# Patient Record
Sex: Female | Born: 1975 | Race: White | Hispanic: No | Marital: Married | Smoking: Never smoker
Health system: Southern US, Community
[De-identification: ages and names within clinical notes are randomized; demographics above are authoritative.]

## PROBLEM LIST (undated history)

## (undated) DIAGNOSIS — F419 Anxiety disorder, unspecified: Secondary | ICD-10-CM

## (undated) DIAGNOSIS — F329 Major depressive disorder, single episode, unspecified: Secondary | ICD-10-CM

## (undated) DIAGNOSIS — B009 Herpesviral infection, unspecified: Secondary | ICD-10-CM

## (undated) DIAGNOSIS — O480 Post-term pregnancy: Secondary | ICD-10-CM

## (undated) DIAGNOSIS — F32A Depression, unspecified: Secondary | ICD-10-CM

## (undated) DIAGNOSIS — O26849 Uterine size-date discrepancy, unspecified trimester: Secondary | ICD-10-CM

## (undated) HISTORY — PX: WISDOM TOOTH EXTRACTION: SHX21

## (undated) HISTORY — DX: Herpesviral infection, unspecified: B00.9

## (undated) HISTORY — PX: FRACTURE SURGERY: SHX138

## (undated) HISTORY — PX: ANKLE SURGERY: SHX546

## (undated) HISTORY — PX: WRIST SURGERY: SHX841

## (undated) HISTORY — DX: Depression, unspecified: F32.A

## (undated) HISTORY — DX: Major depressive disorder, single episode, unspecified: F32.9

---

## 2010-12-20 ENCOUNTER — Ambulatory Visit
Admission: RE | Admit: 2010-12-20 | Discharge: 2010-12-20 | Disposition: A | Discharge status: Home / Self Care | Payer: 59 | Source: Ambulatory Visit | Attending: Family Medicine | Admitting: Family Medicine

## 2010-12-20 ENCOUNTER — Other Ambulatory Visit: Payer: Self-pay | Admitting: Family Medicine

## 2010-12-20 ENCOUNTER — Ambulatory Visit (INDEPENDENT_AMBULATORY_CARE_PROVIDER_SITE_OTHER): Payer: 59 | Admitting: Family Medicine

## 2010-12-20 VITALS — BP 100/60 | Ht 67.0 in | Wt 135.0 lb

## 2010-12-20 DIAGNOSIS — M25572 Pain in left ankle and joints of left foot: Secondary | ICD-10-CM | POA: Insufficient documentation

## 2010-12-20 DIAGNOSIS — N912 Amenorrhea, unspecified: Secondary | ICD-10-CM

## 2010-12-20 DIAGNOSIS — M25579 Pain in unspecified ankle and joints of unspecified foot: Secondary | ICD-10-CM

## 2010-12-20 DIAGNOSIS — N926 Irregular menstruation, unspecified: Secondary | ICD-10-CM

## 2010-12-20 MED ORDER — HYDROCODONE-ACETAMINOPHEN 5-325 MG PO TABS: 2.0000 | ORAL_TABLET | Freq: Three times a day (TID) | ORAL | Status: AC | PRN

## 2010-12-20 NOTE — Progress Notes (Signed)
  Subjective:    Patient ID: Latoya Weiss, female    DOB: 01/23/1976, 35 y.o.   MRN: [id number]  HPI  Date of injury December 18, 2010 She was jumping from rock to rock while out hiking. Came down really hard on her left ankle. Immediately had pain. Was able to hobble back to car  using assistance. She was wearing hiking boots. Pain has continued. She's got a lot of swelling g and stiffness. She is not really able to bear weight, except with crutches which she has been using.   PERTINENT  PMH / PSH: History of left wrist fracture requiring 3 separate surgeries in 2011. No history of any other fractures. Nonsmoker. Review of Systems Denies unusual weight change recently. She is currently trying to get pregnant. Denies fever. Denies numbness in her left foot.    Objective:   Physical Exam  Vital signs reviewed. GENERAL: Well-developed female no acute distress. She is using crutches to walk. ANKLE:  left.  . She is tender over the medial malleolus. The lateral malleolus is nontender to palpation The heel is somewhat tender to squeeze. Distally all of her toes have full range of motion in flexion and extension.  Longitudinal and transverse arches are maintained. Nontender over 5th MT and 1st MT SKIN: Significant ecchymoses on the area behind the medial malleolus extending up into the calf.:. There is no bruising or ecchymoses on the lateral portion of her foot.  NEURO: intact sensation to soft touch throughout the foot and ankle VASCULAR: normal capillary refill left foot and ankle ULTRASOUND: Repair fracture of the medial malleoli or area.      Assessment & Plan:  Concerned for ankle fracture. I have placed her in a Cam Walker boot and will have her do touchdown weightbearing only with her crutches. We will get x-rays and I will call her. [phone]

## 2010-12-20 NOTE — Progress Notes (Signed)
Medial tibia fracture--transverse--appears displaced. Appt made at [facility] today

## 2011-03-10 ENCOUNTER — Ambulatory Visit (INDEPENDENT_AMBULATORY_CARE_PROVIDER_SITE_OTHER): Payer: Self-pay | Admitting: Sports Medicine

## 2011-03-10 VITALS — BP 110/60

## 2011-03-10 DIAGNOSIS — M79672 Pain in left foot: Secondary | ICD-10-CM

## 2011-03-10 DIAGNOSIS — M79609 Pain in unspecified limb: Secondary | ICD-10-CM

## 2011-03-10 NOTE — Progress Notes (Signed)
  Subjective:    Patient ID: Latoya Weiss, female    DOB: [birth year], 36 y.o.   MRN: [id number]  HPI In 2010 she fell on outstretched left wrist and distal radial fx that required 3 surgeries  Oct 2012 jumped down and fractured left medial malleolus and had this pinned Seen first by Dr Wadie Lessen to Dr Dion Saucier who did the surgery  Dec 26 able to get out of [place]oot and walking since then and feeling better  Left forefoot started hurting 4 days ago after walking about 1 mile   Review of Systems     Objective:   Physical Exam  Patient is not in acute distress but is walking with a limp favoring the left foot On examination she has swelling in the left lateral forefoot on the dorsum The third metatarsal is tender to pressure The first second fourth and fifth are nontender The MTP joints are nonpainful She has a neutral arch and does not handout any real forefoot break down  Scar over the medial malleolus is nontender There is some visual change and still some slight swelling in the ankle area There is no real tenderness over the area of prior surgery  MSK ultrasound Left metatarsals are all scanned and appear normal with the exception of Fourth metatarsal shows a hypoechoic lying just above the distal shaft The first MTP joint is unremarkable Doppler activity is only minimally increased No cortical disruption is seen  Scan of the medial malleolus and this looks to be healing well with intact bone Note there is not any joint effusion of the ankle on scanning      Assessment & Plan:

## 2011-03-10 NOTE — Assessment & Plan Note (Signed)
I think she simply walked too much since she had been out of the boot for a relatively short period of time I am concerned because she's had a couple of fractures in the last 2 years but these do not sound like they come from low bone density but rather from accidents She has normal menses and no major risk factors  This appears more like a bone stress reaction and not a true stress fracture so we will put her in a postop shoe for one week These arch straps Avoid walking Ice at the end of the day Okay to cross train on the bike  Will check the degree of swelling in a week to 10 days and to transition her back to a shoe

## 2011-05-05 ENCOUNTER — Other Ambulatory Visit (HOSPITAL_COMMUNITY)
Admission: RE | Admit: 2011-05-05 | Discharge: 2011-05-05 | Disposition: A | Discharge status: Home / Self Care | Payer: 59 | Source: Ambulatory Visit | Attending: Obstetrics and Gynecology | Admitting: Obstetrics and Gynecology

## 2011-05-05 DIAGNOSIS — Z01419 Encounter for gynecological examination (general) (routine) without abnormal findings: Secondary | ICD-10-CM | POA: Insufficient documentation

## 2011-05-05 DIAGNOSIS — N760 Acute vaginitis: Secondary | ICD-10-CM | POA: Insufficient documentation

## 2011-05-05 DIAGNOSIS — Z1159 Encounter for screening for other viral diseases: Secondary | ICD-10-CM | POA: Insufficient documentation

## 2011-06-01 ENCOUNTER — Ambulatory Visit (INDEPENDENT_AMBULATORY_CARE_PROVIDER_SITE_OTHER): Payer: 59 | Admitting: Family Medicine

## 2011-06-01 VITALS — BP 100/70

## 2011-06-01 DIAGNOSIS — M25579 Pain in unspecified ankle and joints of unspecified foot: Secondary | ICD-10-CM

## 2011-06-01 DIAGNOSIS — M25572 Pain in left ankle and joints of left foot: Secondary | ICD-10-CM

## 2011-06-02 NOTE — Progress Notes (Signed)
  Subjective:    Patient ID: Latoya Weiss, female    DOB: May 12, 1975, 36 y.o.   MRN: [id number]  HPI 36 y/o female s/p 3 months s/p ORIF of the left ankle is here c/o continued pain in the area of the incision as well as tightness and pain in the area of the achille's tendon.  She is recently recovered from a metatarsal stress fracture of the same foot but has no recent trauma.    Review of Systems     Objective:   Physical Exam  Surgical incision is clean, dry, and intact with no sign of infection There is mild swelling in the area  Tenderness to palpation about the area of the incision Tenderness to palpation about the medial aspect of the achille's tendon No swelling about the achille's tendon Decreased ROM in plantar and dorsiflexion at the ankle Gait is normal Mild tenderness to palpation along the posterior medial malleolus   Ultrasound; There is some edema surrounding the posterior tib tendon just inferior to the medial malleolus but the tendon appears intact. The achille's tendon is intact with no fluid collection.  There is no fluid collection in the posterior calcaneal bursa.  There is no increased doppler activity at the posterior tib tendon or at the achille's tendon.       Assessment & Plan:

## 2011-06-02 NOTE — Assessment & Plan Note (Addendum)
Heel wedge, topical anti-inflammatory #5 from [facility] pharmacy, and eccentric training for the Achille's tendon stiffness.  We will do formal physical therapy for ankle ROM.  Physical therapy will also do modalities for the achille's.  We will see her back in one month.  Regarding the incisional pain, if this continues she should follow up with ortho for evaluation.  It is unclear if this is totally incisional or if it is due to a hardware issue.

## 2011-06-14 ENCOUNTER — Ambulatory Visit: Payer: 59 | Attending: Family Medicine

## 2011-06-14 DIAGNOSIS — IMO0001 Reserved for inherently not codable concepts without codable children: Secondary | ICD-10-CM | POA: Insufficient documentation

## 2011-06-14 DIAGNOSIS — M25579 Pain in unspecified ankle and joints of unspecified foot: Secondary | ICD-10-CM | POA: Insufficient documentation

## 2011-06-14 DIAGNOSIS — M25673 Stiffness of unspecified ankle, not elsewhere classified: Secondary | ICD-10-CM | POA: Insufficient documentation

## 2011-06-14 DIAGNOSIS — M25676 Stiffness of unspecified foot, not elsewhere classified: Secondary | ICD-10-CM | POA: Insufficient documentation

## 2011-06-14 DIAGNOSIS — R262 Difficulty in walking, not elsewhere classified: Secondary | ICD-10-CM | POA: Insufficient documentation

## 2011-06-20 ENCOUNTER — Ambulatory Visit: Payer: 59

## 2011-06-22 ENCOUNTER — Ambulatory Visit: Payer: 59

## 2011-06-27 ENCOUNTER — Ambulatory Visit: Payer: 59

## 2011-06-29 ENCOUNTER — Ambulatory Visit: Payer: 59 | Attending: Family Medicine

## 2011-06-29 ENCOUNTER — Ambulatory Visit (INDEPENDENT_AMBULATORY_CARE_PROVIDER_SITE_OTHER): Payer: 59 | Admitting: Family Medicine

## 2011-06-29 ENCOUNTER — Encounter: Payer: Self-pay | Admitting: Family Medicine

## 2011-06-29 VITALS — BP 123/78 | HR 103

## 2011-06-29 DIAGNOSIS — R262 Difficulty in walking, not elsewhere classified: Secondary | ICD-10-CM | POA: Insufficient documentation

## 2011-06-29 DIAGNOSIS — M25572 Pain in left ankle and joints of left foot: Secondary | ICD-10-CM

## 2011-06-29 DIAGNOSIS — IMO0001 Reserved for inherently not codable concepts without codable children: Secondary | ICD-10-CM | POA: Insufficient documentation

## 2011-06-29 DIAGNOSIS — M25579 Pain in unspecified ankle and joints of unspecified foot: Secondary | ICD-10-CM

## 2011-06-29 DIAGNOSIS — M25676 Stiffness of unspecified foot, not elsewhere classified: Secondary | ICD-10-CM | POA: Insufficient documentation

## 2011-06-29 DIAGNOSIS — M25673 Stiffness of unspecified ankle, not elsewhere classified: Secondary | ICD-10-CM | POA: Insufficient documentation

## 2011-06-29 NOTE — Progress Notes (Signed)
  Subjective:    Patient ID: Latoya Weiss, female    DOB: 10-13-1975, 36 y.o.   MRN: [id number]  HPI 36 y/o female is for follow up for continue left ankle pain and tightness after ORIF of a medial malleolus fracture in October 2012.  She has been to physical therapy and feels that the tightness is improved.  She still cannot jog without a limp and pain.  She uses dermatran topical which helps the pain when she gets it.   Review of Systems     Objective:   Physical Exam  ROM from PT: DF102; PF 54; IN 40; EV 22 No tenderness to palpation at the achilles Mild tenderness to palpation near the incision, the swelling in this area is much improved from the previous exam The skin is intact.  The screws are not palpable.  Gait: Walk is neutral but she has a noticeable limp with any attempt at jogging.  She is not able to move into a full jog, more like a fast walk.      Assessment & Plan:

## 2011-06-29 NOTE — Patient Instructions (Signed)
Follow up appointment is made for 17 June.  There are no changes in the treatment plan for now.

## 2011-06-29 NOTE — Assessment & Plan Note (Signed)
She is improved from her previous visit but still has a significant amount of pain for being over 6 months post surgery.  I have discussed her case with Dr. Dion Saucier, the surgeon.  He says that if she has continued pain that she should follow up with him for evaluation to discuss whether removal of the hardware would be beneficial.  The patient would not rather follow up with the surgeon at this point.  She will continue with physical therapy and dermatran topical #5 for pain.

## 2011-07-04 ENCOUNTER — Ambulatory Visit: Payer: 59

## 2011-07-07 ENCOUNTER — Ambulatory Visit: Payer: 59

## 2011-07-11 ENCOUNTER — Ambulatory Visit: Payer: 59

## 2011-07-14 ENCOUNTER — Ambulatory Visit: Payer: 59

## 2011-08-04 ENCOUNTER — Ambulatory Visit: Payer: 59 | Attending: Family Medicine

## 2011-08-04 DIAGNOSIS — M25579 Pain in unspecified ankle and joints of unspecified foot: Secondary | ICD-10-CM | POA: Insufficient documentation

## 2011-08-04 DIAGNOSIS — M25676 Stiffness of unspecified foot, not elsewhere classified: Secondary | ICD-10-CM | POA: Insufficient documentation

## 2011-08-04 DIAGNOSIS — R262 Difficulty in walking, not elsewhere classified: Secondary | ICD-10-CM | POA: Insufficient documentation

## 2011-08-04 DIAGNOSIS — IMO0001 Reserved for inherently not codable concepts without codable children: Secondary | ICD-10-CM | POA: Insufficient documentation

## 2011-08-04 DIAGNOSIS — M25673 Stiffness of unspecified ankle, not elsewhere classified: Secondary | ICD-10-CM | POA: Insufficient documentation

## 2011-08-15 ENCOUNTER — Ambulatory Visit (INDEPENDENT_AMBULATORY_CARE_PROVIDER_SITE_OTHER): Payer: 59 | Admitting: Sports Medicine

## 2011-08-15 VITALS — BP 100/68

## 2011-08-15 DIAGNOSIS — M79672 Pain in left foot: Secondary | ICD-10-CM

## 2011-08-15 DIAGNOSIS — M79609 Pain in unspecified limb: Secondary | ICD-10-CM

## 2011-08-15 DIAGNOSIS — M25572 Pain in left ankle and joints of left foot: Secondary | ICD-10-CM

## 2011-08-15 DIAGNOSIS — M25579 Pain in unspecified ankle and joints of unspecified foot: Secondary | ICD-10-CM

## 2011-08-15 NOTE — Patient Instructions (Addendum)
We will call you when we get the medium bodyhelix x ankle sleeves in the next few days  Try alternating 1 min jog and 1 min walk on treadmill start with 10 minutes-  increase by 5 minutes each week  When you get comfortable running on treadmill for 30 minutes - It is ok to try running outside  Please follow up in 6 weeks  Thank you for seeing [place] today!

## 2011-08-15 NOTE — Progress Notes (Signed)
  Subjective:    Patient ID: Latoya Weiss, female    DOB: October 18, 1975, 36 y.o.   MRN: [id number]  HPI  Latoya Weiss returns from a period of time doing PT at [facility] Left ankle feels stronger No longer any foot pain over the forefoot (that took 6 weeks) Able to walk now with no limp Running causes  Pain and limp At PT able to run on trampoline for 10 minutes with no pain  Still hypersensitive below scar on left medial malleolus No real swelling in this area now Not as painful as before  By end of day does have pain up to 3/10  Review of Systems     Objective:   Physical Exam  NAD  Left Ankle: No visible erythema or swelling. Scar just below medial malleolus Range of motion is full in all directions. Strength is 5/5 in all directions.  Stable lateral and medial ligaments; squeeze test and kleiger test unremarkable; Talar dome nontender; No pain at base of 5th MT; No tenderness over cuboid; No tenderness over N spot or navicular prominence  tenderness on inferior aspects of medial malleolus and sensitive to touch No sign of peroneal tendon subluxations;  Walks even quickly with no limp  Runs with slight limp on left foot       Assessment & Plan:

## 2011-08-15 NOTE — Assessment & Plan Note (Addendum)
She has gained strength from PT  Can now start HEP at [facility]  Try compression X brace - no longer needs lace-up ASO  Begin progressive walk/jog program on treadmill  No running on hard surfaces prior to reaching 30 mins on Tm or else prior to reck in 6 weeks

## 2011-08-15 NOTE — Assessment & Plan Note (Signed)
This has resolved  Suspect that as her gait became more normal the bone stress resolved  Follow if any return of sxs

## 2012-06-01 LAB — OB RESULTS CONSOLE RPR: RPR: NONREACTIVE

## 2012-06-01 LAB — OB RESULTS CONSOLE ANTIBODY SCREEN: ANTIBODY SCREEN: NEGATIVE

## 2012-06-01 LAB — OB RESULTS CONSOLE GC/CHLAMYDIA
Chlamydia: NEGATIVE
GC PROBE AMP, GENITAL: NEGATIVE

## 2012-06-01 LAB — OB RESULTS CONSOLE HIV ANTIBODY (ROUTINE TESTING): HIV: NONREACTIVE

## 2012-06-01 LAB — OB RESULTS CONSOLE ABO/RH: RH Type: POSITIVE

## 2012-06-01 LAB — OB RESULTS CONSOLE HEPATITIS B SURFACE ANTIGEN: Hepatitis B Surface Ag: NEGATIVE

## 2012-06-01 LAB — OB RESULTS CONSOLE RUBELLA ANTIBODY, IGM: RUBELLA: IMMUNE

## 2012-07-03 ENCOUNTER — Emergency Department (HOSPITAL_BASED_OUTPATIENT_CLINIC_OR_DEPARTMENT_OTHER)
Admission: EM | Admit: 2012-07-03 | Discharge: 2012-07-03 | Disposition: A | Discharge status: Home / Self Care | Payer: 59 | Attending: Emergency Medicine | Admitting: Emergency Medicine

## 2012-07-03 ENCOUNTER — Other Ambulatory Visit: Payer: Self-pay

## 2012-07-03 ENCOUNTER — Encounter (HOSPITAL_BASED_OUTPATIENT_CLINIC_OR_DEPARTMENT_OTHER): Payer: Self-pay | Admitting: *Deleted

## 2012-07-03 ENCOUNTER — Emergency Department (HOSPITAL_BASED_OUTPATIENT_CLINIC_OR_DEPARTMENT_OTHER): Payer: 59

## 2012-07-03 DIAGNOSIS — G8929 Other chronic pain: Secondary | ICD-10-CM | POA: Insufficient documentation

## 2012-07-03 DIAGNOSIS — Z79899 Other long term (current) drug therapy: Secondary | ICD-10-CM | POA: Insufficient documentation

## 2012-07-03 DIAGNOSIS — R5381 Other malaise: Secondary | ICD-10-CM | POA: Insufficient documentation

## 2012-07-03 DIAGNOSIS — F411 Generalized anxiety disorder: Secondary | ICD-10-CM | POA: Insufficient documentation

## 2012-07-03 DIAGNOSIS — R079 Chest pain, unspecified: Secondary | ICD-10-CM | POA: Insufficient documentation

## 2012-07-03 DIAGNOSIS — Z3202 Encounter for pregnancy test, result negative: Secondary | ICD-10-CM | POA: Insufficient documentation

## 2012-07-03 DIAGNOSIS — R209 Unspecified disturbances of skin sensation: Secondary | ICD-10-CM | POA: Insufficient documentation

## 2012-07-03 HISTORY — DX: Anxiety disorder, unspecified: F41.9

## 2012-07-03 LAB — URINALYSIS, ROUTINE W REFLEX MICROSCOPIC
Glucose, UA: NEGATIVE mg/dL
Leukocytes, UA: NEGATIVE
Specific Gravity, Urine: 1.006 (ref 1.005–1.030)
pH: 6.5 (ref 5.0–8.0)

## 2012-07-03 LAB — TROPONIN I: Troponin I: <0.3 ng/mL (ref <0.30)

## 2012-07-03 LAB — D-DIMER, QUANTITATIVE: D-Dimer, Quant: <0.27 ug/mL-FEU (ref 0.00–0.48)

## 2012-07-03 LAB — URINE MICROSCOPIC-ADD ON

## 2012-07-03 LAB — PREGNANCY, URINE: Preg Test, Ur: NEGATIVE

## 2012-07-03 NOTE — ED Provider Notes (Signed)
History     CSN: [id number]  Arrival date & time 07/03/12  1922   First MD Initiated Contact with Patient 07/03/12 2125      Chief Complaint  Patient presents with  . Abdominal Pain   chest pain  (Consider location/radiation/quality/duration/timing/severity/associated sxs/prior treatment) HPI This 37 year old female has to 3 weeks of constant left-sided anterior well localized nonradiating chest pain is not abdominal pain contrary to the original chief complaint, she is no nausea vomiting diarrhea no vaginal bleeding or discharge no dysuria no cough no fever no shortness breath no back pain no change in her chronic left arm pain weakness and numbness which she has had stable for years, she does not have any travel or immobilization recent surgery but she does have estrogen hormone therapy, her pain is sharp stabbing mild to moderately severe worse with palpation and torso position changes. Past Medical History  Diagnosis Date  . Anxiety     Past Surgical History  Procedure Laterality Date  . Wrist surgery    . Ankle surgery    . Wisdom tooth extraction      No family history on file.  History  Substance Use Topics  . Smoking status: Never Smoker   . Smokeless tobacco: Never Used  . Alcohol Use: Yes    OB History   Grav Para Term Preterm Abortions TAB SAB Ect Mult Living                  Review of Systems 10 Systems reviewed and are negative for acute change except as noted in the HPI. Allergies  Review of patient's allergies indicates no known allergies.  Home Medications   Current Outpatient Rx  Name  Route  Sig  Dispense  Refill  . BuPROPion HCl (WELLBUTRIN PO)   Oral   Take by mouth daily.           . Norethindrone Acetate-Ethinyl Estradiol (MICROGESTIN) 1.5-30 MG-MCG tablet   Oral   Take 1 tablet by mouth daily.             BP 135/67  Pulse 77  Temp(Src) 98.3 F (36.8 C) (Oral)  Resp 20  Wt 125 lb (56.7 kg)  BMI 19.57 kg/m2  SpO2 100%  LMP  06/04/2012  Physical Exam  Nursing note and vitals reviewed. Constitutional:  Awake, alert, nontoxic appearance.  HENT:  Head: Atraumatic.  Eyes: Right eye exhibits no discharge. Left eye exhibits no discharge.  Neck: Neck supple.  Cardiovascular: Normal rate and regular rhythm.   No murmur heard. Pulmonary/Chest: Effort normal and breath sounds normal. No respiratory distress. She has no wheezes. She has no rales. She exhibits tenderness.  Reproducible left anterior chest wall tenderness without rash  Abdominal: Soft. There is no tenderness. There is no rebound.  Musculoskeletal: She exhibits no edema and no tenderness.  Baseline ROM, no obvious new focal weakness.  Neurological:  Mental status and motor strength appears baseline for patient and situation.  Skin: No rash noted.  Psychiatric: She has a normal mood and affect.    ED Course  Procedures (including critical care time) ECG: Normal sinus rhythm, ventricular rate 82, normal axis, RSR time in lead V1, no acute ischemic changes noted, no comparison ECG available Labs Reviewed  URINALYSIS, ROUTINE W REFLEX MICROSCOPIC - Abnormal; Notable for the following:    Hgb urine dipstick MODERATE (*)    All other components within normal limits  PREGNANCY, URINE  URINE MICROSCOPIC-ADD ON  TROPONIN I  D-DIMER,  QUANTITATIVE   Dg Chest 2 View  07/03/2012  *RADIOLOGY REPORT*  Clinical Data: 37 year old female chest pain on the left, under the left rib cage.  Shortness of breath.  CHEST - 2 VIEW  Comparison: None.  Findings: Lung volumes are within normal limits.  Cardiac size and mediastinal contours are within normal limits.  Visualized tracheal air column is within normal limits.  No pneumothorax.  No pleural effusion, pulmonary edema or confluent pulmonary opacity.  Minimal to mild scoliosis. No acute osseous abnormality identified.  IMPRESSION: No acute cardiopulmonary abnormality.   Original Report Authenticated By: Erskine Speed, M.D.       1. Chest pain       MDM  Patient / Family / Caregiver informed of clinical course, understand medical decision-making process, and agree with plan.  I doubt any other EMC precluding discharge at this time including, but not necessarily limited to the following:ACS, PE.        Hurman Horn, MD 07/04/12 2233

## 2012-07-03 NOTE — ED Notes (Signed)
Pain in her left upper quad for 3 weeks.

## 2012-07-12 ENCOUNTER — Ambulatory Visit (INDEPENDENT_AMBULATORY_CARE_PROVIDER_SITE_OTHER): Payer: 59 | Admitting: Internal Medicine

## 2012-07-12 VITALS — BP 130/70 | Ht 66.0 in | Wt 128.0 lb

## 2012-07-12 DIAGNOSIS — R071 Chest pain on breathing: Secondary | ICD-10-CM

## 2012-07-12 DIAGNOSIS — S2232XA Fracture of one rib, left side, initial encounter for closed fracture: Secondary | ICD-10-CM

## 2012-07-12 DIAGNOSIS — Z3183 Encounter for assisted reproductive fertility procedure cycle: Secondary | ICD-10-CM | POA: Insufficient documentation

## 2012-07-12 DIAGNOSIS — S2239XA Fracture of one rib, unspecified side, initial encounter for closed fracture: Secondary | ICD-10-CM

## 2012-07-12 DIAGNOSIS — R0789 Other chest pain: Secondary | ICD-10-CM

## 2012-07-12 MED ORDER — MELOXICAM 15 MG PO TABS: 15.0000 mg | ORAL_TABLET | Freq: Every day | ORAL | Status: DC

## 2012-07-12 MED ORDER — HYDROCODONE-ACETAMINOPHEN 5-325 MG PO TABS: ORAL_TABLET | ORAL | Status: DC

## 2012-07-12 NOTE — Progress Notes (Signed)
  Subjective:    Patient ID: Latoya Weiss, female    DOB: September 25, 1975, 37 y.o.   MRN: [id number]  HPI37yo with 5 weeks chest pain Emergency room evaluation several days ago had normal EKG chest x-ray and d-dimer. She continues to experience mechanical-type pain in the left anterior chest wall without shortness of breath She continues to try to exercise although several things or painful There is concern that she has had several fractures in recent years   Husband out of work for the last year Review of Systems Currently undergoing in vitro fertilization    Objective:   Physical Exam Vital signs stable HEENT clear Heart regular without murmur Lungs clear Deep inspiration creates pain left anterior chest wall There is tenderness over the fourth and fifth ribs in the anterior axillary line just below the left breast  Ultrasound evaluation per Dr. Darrick Penna suggest nondisplaced fractures in this area Review of the emergency room x-ray has question of nondisplaced fracture line in the PA chest left 5th rib       Assessment & Plan:  Problem #1 chest wall pain secondary to nondisplaced rib fracture of uncertain etiology  Plan meloxicam

## 2012-09-11 ENCOUNTER — Encounter: Payer: Self-pay | Admitting: Internal Medicine

## 2012-09-11 ENCOUNTER — Encounter: Payer: Self-pay | Admitting: Radiology

## 2012-09-11 ENCOUNTER — Telehealth: Payer: Self-pay | Admitting: Radiology

## 2012-09-11 DIAGNOSIS — Z3400 Encounter for supervision of normal first pregnancy, unspecified trimester: Secondary | ICD-10-CM | POA: Insufficient documentation

## 2012-09-11 DIAGNOSIS — O0001 Abdominal pregnancy with intrauterine pregnancy: Secondary | ICD-10-CM

## 2012-09-11 NOTE — Telephone Encounter (Signed)
Will you accept her as a new patient?

## 2012-09-12 ENCOUNTER — Telehealth: Payer: Self-pay | Admitting: Radiology

## 2012-09-12 NOTE — Telephone Encounter (Signed)
error 

## 2012-09-12 NOTE — Telephone Encounter (Signed)
Mychart message sent to patient.

## 2012-09-15 NOTE — Telephone Encounter (Signed)
Per Dr Merla Riches is going to be this patient's PCP

## 2012-10-01 LAB — OB RESULTS CONSOLE GC/CHLAMYDIA
CHLAMYDIA, DNA PROBE: NEGATIVE
Gonorrhea: NEGATIVE

## 2012-10-24 ENCOUNTER — Ambulatory Visit: Payer: Self-pay | Admitting: Internal Medicine

## 2012-10-31 ENCOUNTER — Ambulatory Visit (INDEPENDENT_AMBULATORY_CARE_PROVIDER_SITE_OTHER): Payer: 59 | Admitting: Internal Medicine

## 2012-10-31 ENCOUNTER — Encounter: Payer: Self-pay | Admitting: Internal Medicine

## 2012-10-31 VITALS — BP 100/60 | HR 103 | Temp 98.8°F | Resp 16 | Ht 66.5 in | Wt 125.6 lb

## 2012-10-31 DIAGNOSIS — Z23 Encounter for immunization: Secondary | ICD-10-CM

## 2012-10-31 DIAGNOSIS — R5381 Other malaise: Secondary | ICD-10-CM

## 2012-10-31 NOTE — Progress Notes (Signed)
Here to estab PCP Patient Active Problem List   Diagnosis Date Noted  . Abdominal pregnancy with intrauterine pregnancy 09/11/2012  . Patient undergoing in vitro fertilization 07/12/2012  . Foot pain, left--resolv 03/10/2011  . 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

## 2012-11-08 ENCOUNTER — Other Ambulatory Visit: Payer: Self-pay | Admitting: Internal Medicine

## 2012-11-08 MED ORDER — BUPROPION HCL ER (XL) 300 MG PO TB24: 300.0000 mg | ORAL_TABLET | Freq: Every day | ORAL | Status: DC

## 2012-11-08 NOTE — Progress Notes (Signed)
Meds ordered this encounter  Medications  . buPROPion (WELLBUTRIN XL) 300 MG 24 hr tablet    Sig: Take 1 tablet (300 mg total) by mouth daily.    Dispense:  90 tablet    Refill:  0

## 2012-12-31 ENCOUNTER — Encounter: Payer: Self-pay | Admitting: Internal Medicine

## 2013-01-03 NOTE — Telephone Encounter (Signed)
Reply sent

## 2013-01-23 LAB — OB RESULTS CONSOLE RPR: RPR: NONREACTIVE

## 2013-01-23 LAB — OB RESULTS CONSOLE HIV ANTIBODY (ROUTINE TESTING): HIV: NONREACTIVE

## 2013-02-18 ENCOUNTER — Other Ambulatory Visit: Payer: Self-pay | Admitting: Internal Medicine

## 2013-04-03 ENCOUNTER — Ambulatory Visit (INDEPENDENT_AMBULATORY_CARE_PROVIDER_SITE_OTHER): Payer: 59 | Admitting: Emergency Medicine

## 2013-04-03 ENCOUNTER — Encounter: Payer: Self-pay | Admitting: Emergency Medicine

## 2013-04-03 VITALS — BP 123/81 | HR 101 | Ht 66.5 in | Wt 152.0 lb

## 2013-04-03 DIAGNOSIS — M79672 Pain in left foot: Secondary | ICD-10-CM

## 2013-04-03 DIAGNOSIS — M79609 Pain in unspecified limb: Secondary | ICD-10-CM

## 2013-04-03 DIAGNOSIS — M25572 Pain in left ankle and joints of left foot: Secondary | ICD-10-CM

## 2013-04-03 DIAGNOSIS — M25579 Pain in unspecified ankle and joints of unspecified foot: Secondary | ICD-10-CM

## 2013-04-03 DIAGNOSIS — M79674 Pain in right toe(s): Secondary | ICD-10-CM | POA: Insufficient documentation

## 2013-04-03 NOTE — Assessment & Plan Note (Signed)
Exam ultrasound findings concerning for capsular disruption. Patient given a steel insert for her shoe as well as encouraged to continue taping it she has been. Followup in one to 2 months.

## 2013-04-03 NOTE — Progress Notes (Signed)
Patient ID: Latoya Weiss, female   DOB: 12/10/1975, 38 y.o.   MRN: [id number] 38 year old female with complaint of right great toe pain. Occurred after getting up off the couch approximately 3 weeks ago. Pain is worse with flexion of great toe. Pain across the dorsal aspect of the right great toe. No swelling or bruising noted. She's been keeping foot across the toe to help prevent dorsiflexion. This has been helping with Latoya Weiss symptoms.  Pertinent past medical history patient currently [redacted] weeks pregnant.  Social history: Nonsmoker no alcohol.  Review of systems as per history of present illness otherwise negative  Examination:  BP 123/81  Pulse 101  Ht 5' 6.5" (1.689 m)  Wt 152 lb (68.947 kg)  BMI 24.17 kg/m2  LMP 06/04/2012 Well-developed well-nourished 38 year old female awake alert and oriented in no acute distress Right foot:  Tenderness to palpation on the dorsal/medial portion of the first MTP joint. No swelling noted. No pain with varus or valgus stress of the great toe. Pain reproduced with dorsiflexion. No pain with plantar flexion. Strength intact.  Muscle skeletal ultrasound was performed of the first MTP joint. Small amount of fluid was noted around the capsule of the first MTP joint medially on the dorsal side. Tendon and bone intact without evidence of disruption or bone spurs.

## 2013-04-08 ENCOUNTER — Other Ambulatory Visit: Payer: Self-pay | Admitting: Internal Medicine

## 2013-04-10 MED ORDER — BUPROPION HCL ER (XL) 300 MG PO TB24: ORAL_TABLET | ORAL | Status: DC

## 2013-04-24 ENCOUNTER — Other Ambulatory Visit: Payer: Self-pay | Admitting: Internal Medicine

## 2013-04-24 ENCOUNTER — Inpatient Hospital Stay (HOSPITAL_COMMUNITY): Admission: AD | Admit: 2013-04-24 | Payer: Self-pay | Source: Ambulatory Visit | Admitting: Obstetrics and Gynecology

## 2013-04-30 ENCOUNTER — Inpatient Hospital Stay (HOSPITAL_COMMUNITY): Payer: 59

## 2013-04-30 ENCOUNTER — Encounter (HOSPITAL_COMMUNITY): Payer: Self-pay | Admitting: *Deleted

## 2013-04-30 ENCOUNTER — Telehealth (HOSPITAL_COMMUNITY): Payer: Self-pay | Admitting: *Deleted

## 2013-04-30 LAB — OB RESULTS CONSOLE GBS: GBS: POSITIVE

## 2013-04-30 NOTE — Telephone Encounter (Signed)
Preadmission screen  

## 2013-05-02 ENCOUNTER — Encounter (HOSPITAL_COMMUNITY): Payer: Self-pay

## 2013-05-02 DIAGNOSIS — O480 Post-term pregnancy: Secondary | ICD-10-CM

## 2013-05-02 DIAGNOSIS — O26849 Uterine size-date discrepancy, unspecified trimester: Secondary | ICD-10-CM | POA: Insufficient documentation

## 2013-05-02 HISTORY — DX: Post-term pregnancy: O48.0

## 2013-05-02 HISTORY — DX: Uterine size-date discrepancy, unspecified trimester: O26.849

## 2013-05-02 MED ORDER — BUPROPION HCL ER (XL) 150 MG PO TB24
150.0000 mg | ORAL_TABLET | Freq: Every day | ORAL | Status: DC
Start: 2013-05-03 — End: 2013-05-05
  Administered 2013-05-04 – 2013-05-05 (×2): 150 mg via ORAL
  Filled 2013-05-02 (×4): qty 1

## 2013-05-02 NOTE — H&P (Signed)
Latoya Weiss is a 38 y.o. female G1P0 at 53+ for IOL.  Pregnancy from IVF.  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  Uterus is measuring S<D, growth [place]  - appropriate growth by serial [place].  + GBBS.  +FM, no LOF, no VB, occ ctx.  Pregnancy followed with BW NST - all reactive.    Maternal Medical History:  Contractions: Frequency: irregular.    Fetal activity: Perceived fetal activity is normal.    Prenatal Complications - Diabetes: none.    OB History   Grav Para Term Preterm Abortions TAB SAB Ect Mult Living   1             G1 present, IVF  + Abn pap 05/25/12 WNL, HR HPV neg, h/o [facility] No STDs  Past Medical History  Diagnosis Date  . Anxiety   . Depression   . Herpes   . Uterine size-date discrepancy 05/02/2013   Past Surgical History  Procedure Laterality Date  . Wrist surgery    . Ankle surgery    . Wisdom tooth extraction    . Fracture surgery     Family History: family history includes Anxiety disorder in her maternal grandmother and mother; Diabetes in her maternal grandmother. Social History:  reports that she has never smoked. She has never used smokeless tobacco. She reports that she does not drink alcohol or use illicit drugs. married.  Works for [employer], Wellbutrin All NKDA   Prenatal Transfer Tool  Maternal Diabetes: No Genetic Screening: Normal Maternal Ultrasounds/Referrals: Abnormal:  Findings:   Isolated EIF (echogenic intracardiac focus), Other:?duplicate renal collecting system Fetal Ultrasounds or other Referrals:  None Maternal Substance Abuse:  No Significant Maternal Medications:  Meds include: Other: Wellbutrin Significant Maternal Lab Results:  Lab values include: Group B Strep positive Other Comments:  S<D followed by [place], appropriate growth, husband with atypical antibody, pt neg ab screen  Review of Systems  Constitutional: Negative.   HENT: Negative.   Eyes: Negative.    Respiratory: Negative.   Cardiovascular: Negative.   Gastrointestinal: Negative.   Genitourinary: Negative.   Musculoskeletal: Negative.   Skin: Negative.   Neurological: Negative.   Psychiatric/Behavioral: Negative.       Last menstrual period 04/29/2012. Maternal Exam:  Abdomen: Fundal height is 37-38cm, [place] appropriate growth.   Estimated fetal weight is [place] 1/21 5#4.   Fetal presentation: vertex  Introitus: Normal vulva. Normal vagina.  Pelvis: adequate for delivery.   Cervix: Cervix evaluated by digital exam.     Physical Exam  Constitutional: She is oriented to person, place, and time. She appears well-developed and well-nourished.  HENT:  Head: Normocephalic and atraumatic.  Cardiovascular: Normal rate and regular rhythm.   Respiratory: Effort normal and breath sounds normal. No respiratory distress. She has no wheezes.  GI: Soft. Bowel sounds are normal. She exhibits no distension. There is no tenderness.  Musculoskeletal: Normal range of motion.  Neurological: She is alert and oriented to person, place, and time.  Skin: Skin is warm and dry.  Psychiatric: She has a normal mood and affect. Her behavior is normal.    Prenatal labs: ABO, Rh: O/Positive/-- (04/04 0000) Antibody: Negative (04/04 0000) Rubella: Immune (04/04 0000) RPR: Nonreactive (11/26 0000)  HBsAg: Negative (04/04 0000)  HIV: Non-reactive (11/26 0000)  GBS: Positive (03/03 0000)   Pap WNL HR HPV neg/ Ur Cx neg/ GC neg/ Chl neg/ First Tri and AFP WNL/glucola 127/Hgb 12.9  Nl anat,  isolated EIF, ant plac, female S<D - appreopriate growth by [place], BW NST after EDC  Tdap 01/22/13  SVE 3/90/0  Assessment/Plan: 38yo G1P0 at 41+ for iol - given post-dates' GBBS + prophylaxis with PCN AROM/Pitocin to augment AROM after pitocin IV pain meds and epidural prn   BOVARD,Jesson Foskey 05/02/2013, 8:48 PM

## 2013-05-03 ENCOUNTER — Inpatient Hospital Stay (HOSPITAL_COMMUNITY)
Admission: RE | Admit: 2013-05-03 | Discharge: 2013-05-05 | DRG: 775 | Disposition: A | Discharge status: Home / Self Care | Payer: 59 | Source: Ambulatory Visit | Attending: Obstetrics and Gynecology | Admitting: Obstetrics and Gynecology

## 2013-05-03 ENCOUNTER — Encounter (HOSPITAL_COMMUNITY): Payer: Self-pay

## 2013-05-03 VITALS — BP 112/74 | HR 75 | Temp 97.5°F | Resp 18 | Ht 65.0 in | Wt 154.0 lb

## 2013-05-03 DIAGNOSIS — O09519 Supervision of elderly primigravida, unspecified trimester: Secondary | ICD-10-CM | POA: Diagnosis present

## 2013-05-03 DIAGNOSIS — O26849 Uterine size-date discrepancy, unspecified trimester: Secondary | ICD-10-CM | POA: Diagnosis present

## 2013-05-03 DIAGNOSIS — O99892 Other specified diseases and conditions complicating childbirth: Secondary | ICD-10-CM | POA: Diagnosis present

## 2013-05-03 DIAGNOSIS — F329 Major depressive disorder, single episode, unspecified: Secondary | ICD-10-CM | POA: Diagnosis present

## 2013-05-03 DIAGNOSIS — O480 Post-term pregnancy: Principal | ICD-10-CM | POA: Diagnosis present

## 2013-05-03 DIAGNOSIS — O99344 Other mental disorders complicating childbirth: Secondary | ICD-10-CM | POA: Diagnosis present

## 2013-05-03 DIAGNOSIS — Z3400 Encounter for supervision of normal first pregnancy, unspecified trimester: Secondary | ICD-10-CM

## 2013-05-03 DIAGNOSIS — O9989 Other specified diseases and conditions complicating pregnancy, childbirth and the puerperium: Secondary | ICD-10-CM

## 2013-05-03 DIAGNOSIS — Z22330 Carrier of Group B streptococcus: Secondary | ICD-10-CM

## 2013-05-03 DIAGNOSIS — F3289 Other specified depressive episodes: Secondary | ICD-10-CM | POA: Diagnosis present

## 2013-05-03 HISTORY — DX: Uterine size-date discrepancy, unspecified trimester: O26.849

## 2013-05-03 HISTORY — DX: Post-term pregnancy: O48.0

## 2013-05-03 LAB — CBC
HEMATOCRIT: 36.4 % (ref 36.0–46.0)
HEMOGLOBIN: 12.8 g/dL (ref 12.0–15.0)
MCH: 32.2 pg (ref 26.0–34.0)
MCHC: 35.2 g/dL (ref 30.0–36.0)
MCV: 91.7 fL (ref 78.0–100.0)
PLATELETS: 197 10*3/uL (ref 150–400)
RBC: 3.97 MIL/uL (ref 3.87–5.11)
RDW: 13.6 % (ref 11.5–15.5)
WBC: 10.9 10*3/uL — AB (ref 4.0–10.5)

## 2013-05-03 LAB — RPR: RPR Ser Ql: NONREACTIVE

## 2013-05-03 MED ORDER — OXYCODONE-ACETAMINOPHEN 5-325 MG PO TABS
1.0000 | ORAL_TABLET | ORAL | Status: DC | PRN
  Administered 2013-05-03 – 2013-05-04 (×5): 1 via ORAL
  Filled 2013-05-03 (×5): qty 1

## 2013-05-03 MED ORDER — PRENATAL MULTIVITAMIN CH
1.0000 | ORAL_TABLET | Freq: Every day | ORAL | Status: DC
Start: 2013-05-04 — End: 2013-05-05
  Administered 2013-05-04: 1 via ORAL
  Filled 2013-05-03: qty 1

## 2013-05-03 MED ORDER — DESONIDE 0.05 % EX CREA: 1.0000 "application " | TOPICAL_CREAM | Freq: Two times a day (BID) | CUTANEOUS | Status: DC

## 2013-05-03 MED ORDER — TERBUTALINE SULFATE 1 MG/ML IJ SOLN: 0.2500 mg | Freq: Once | INTRAMUSCULAR | Status: DC | PRN

## 2013-05-03 MED ORDER — OXYCODONE-ACETAMINOPHEN 5-325 MG PO TABS
1.0000 | ORAL_TABLET | ORAL | Status: DC | PRN
  Administered 2013-05-03: 1 via ORAL
  Filled 2013-05-03: qty 1

## 2013-05-03 MED ORDER — SIMETHICONE 80 MG PO CHEW: 80.0000 mg | CHEWABLE_TABLET | ORAL | Status: DC | PRN

## 2013-05-03 MED ORDER — IBUPROFEN 600 MG PO TABS
600.0000 mg | ORAL_TABLET | Freq: Four times a day (QID) | ORAL | Status: DC | PRN
  Administered 2013-05-03: 600 mg via ORAL
  Filled 2013-05-03: qty 1

## 2013-05-03 MED ORDER — OXYTOCIN 40 UNITS IN LACTATED RINGERS INFUSION - SIMPLE MED: 62.5000 mL/h | INTRAVENOUS | Status: DC

## 2013-05-03 MED ORDER — BUTORPHANOL TARTRATE 1 MG/ML IJ SOLN
1.0000 mg | INTRAMUSCULAR | Status: DC | PRN
  Administered 2013-05-03: 1 mg via INTRAVENOUS
  Filled 2013-05-03: qty 1

## 2013-05-03 MED ORDER — PHENYLEPHRINE 40 MCG/ML (10ML) SYRINGE FOR IV PUSH (FOR BLOOD PRESSURE SUPPORT)
80.0000 ug | PREFILLED_SYRINGE | INTRAVENOUS | Status: DC | PRN
  Filled 2013-05-03: qty 2
  Filled 2013-05-03: qty 10

## 2013-05-03 MED ORDER — ONDANSETRON HCL 4 MG/2ML IJ SOLN: 4.0000 mg | INTRAMUSCULAR | Status: DC | PRN

## 2013-05-03 MED ORDER — IBUPROFEN 600 MG PO TABS
600.0000 mg | ORAL_TABLET | Freq: Four times a day (QID) | ORAL | Status: DC
Start: 2013-05-03 — End: 2013-05-05
  Administered 2013-05-03 – 2013-05-05 (×6): 600 mg via ORAL
  Filled 2013-05-03 (×7): qty 1

## 2013-05-03 MED ORDER — DIPHENHYDRAMINE HCL 25 MG PO CAPS: 25.0000 mg | ORAL_CAPSULE | Freq: Four times a day (QID) | ORAL | Status: DC | PRN

## 2013-05-03 MED ORDER — ONDANSETRON HCL 4 MG/2ML IJ SOLN: 4.0000 mg | Freq: Four times a day (QID) | INTRAMUSCULAR | Status: DC | PRN

## 2013-05-03 MED ORDER — EPHEDRINE 5 MG/ML INJ
10.0000 mg | INTRAVENOUS | Status: DC | PRN
  Filled 2013-05-03: qty 2
  Filled 2013-05-03: qty 4

## 2013-05-03 MED ORDER — LIDOCAINE HCL (PF) 1 % IJ SOLN
30.0000 mL | INTRAMUSCULAR | Status: DC | PRN
  Filled 2013-05-03: qty 30

## 2013-05-03 MED ORDER — PHENYLEPHRINE 40 MCG/ML (10ML) SYRINGE FOR IV PUSH (FOR BLOOD PRESSURE SUPPORT)
80.0000 ug | PREFILLED_SYRINGE | INTRAVENOUS | Status: DC | PRN
  Filled 2013-05-03: qty 2

## 2013-05-03 MED ORDER — PRENATAL MULTIVITAMIN CH
1.0000 | ORAL_TABLET | Freq: Every day | ORAL | Status: DC
Start: 2013-05-03 — End: 2013-05-03

## 2013-05-03 MED ORDER — OXYTOCIN 40 UNITS IN LACTATED RINGERS INFUSION - SIMPLE MED
1.0000 m[IU]/min | INTRAVENOUS | Status: DC
  Administered 2013-05-03: 2 m[IU]/min via INTRAVENOUS
  Filled 2013-05-03: qty 1000

## 2013-05-03 MED ORDER — VITAMIN B-12 100 MCG PO TABS: 100.0000 ug | ORAL_TABLET | Freq: Every day | ORAL | Status: DC

## 2013-05-03 MED ORDER — LANOLIN HYDROUS EX OINT: TOPICAL_OINTMENT | CUTANEOUS | Status: DC | PRN

## 2013-05-03 MED ORDER — PENICILLIN G POTASSIUM 5000000 UNITS IJ SOLR
2.5000 10*6.[IU] | INTRAVENOUS | Status: DC
  Administered 2013-05-03: 2.5 10*6.[IU] via INTRAVENOUS
  Filled 2013-05-03 (×5): qty 2.5

## 2013-05-03 MED ORDER — OXYTOCIN 40 UNITS IN LACTATED RINGERS INFUSION - SIMPLE MED: 62.5000 mL/h | INTRAVENOUS | Status: AC | PRN

## 2013-05-03 MED ORDER — PENICILLIN G POTASSIUM 5000000 UNITS IJ SOLR
5.0000 10*6.[IU] | Freq: Once | INTRAVENOUS | Status: AC
  Administered 2013-05-03: 5 10*6.[IU] via INTRAVENOUS
  Filled 2013-05-03: qty 5

## 2013-05-03 MED ORDER — WITCH HAZEL-GLYCERIN EX PADS: 1.0000 "application " | MEDICATED_PAD | CUTANEOUS | Status: DC | PRN

## 2013-05-03 MED ORDER — SENNOSIDES-DOCUSATE SODIUM 8.6-50 MG PO TABS
2.0000 | ORAL_TABLET | ORAL | Status: DC
  Administered 2013-05-03 – 2013-05-05 (×2): 2 via ORAL
  Filled 2013-05-03 (×2): qty 2

## 2013-05-03 MED ORDER — BENZOCAINE-MENTHOL 20-0.5 % EX AERO
1.0000 "application " | INHALATION_SPRAY | CUTANEOUS | Status: DC | PRN
  Filled 2013-05-03: qty 56

## 2013-05-03 MED ORDER — AZELAIC ACID 15 % EX GEL: 1.0000 "application " | Freq: Every day | CUTANEOUS | Status: DC

## 2013-05-03 MED ORDER — OXYTOCIN BOLUS FROM INFUSION: 500.0000 mL | INTRAVENOUS | Status: DC

## 2013-05-03 MED ORDER — LACTATED RINGERS IV SOLN: INTRAVENOUS | Status: AC

## 2013-05-03 MED ORDER — TETANUS-DIPHTH-ACELL PERTUSSIS 5-2.5-18.5 LF-MCG/0.5 IM SUSP: 0.5000 mL | Freq: Once | INTRAMUSCULAR | Status: DC

## 2013-05-03 MED ORDER — DIBUCAINE 1 % RE OINT
1.0000 "application " | TOPICAL_OINTMENT | RECTAL | Status: DC | PRN
  Administered 2013-05-05: 1 via RECTAL
  Filled 2013-05-03 (×2): qty 28

## 2013-05-03 MED ORDER — MEASLES, MUMPS & RUBELLA VAC ~~LOC~~ INJ: 0.5000 mL | INJECTION | Freq: Once | SUBCUTANEOUS | Status: DC

## 2013-05-03 MED ORDER — CITRIC ACID-SODIUM CITRATE 334-500 MG/5ML PO SOLN: 30.0000 mL | ORAL | Status: DC | PRN

## 2013-05-03 MED ORDER — LACTATED RINGERS IV SOLN: 500.0000 mL | Freq: Once | INTRAVENOUS | Status: DC

## 2013-05-03 MED ORDER — EPHEDRINE 5 MG/ML INJ
10.0000 mg | INTRAVENOUS | Status: DC | PRN
  Filled 2013-05-03: qty 2

## 2013-05-03 MED ORDER — LACTATED RINGERS IV SOLN
INTRAVENOUS | Status: DC
  Administered 2013-05-03: 08:00:00 via INTRAVENOUS

## 2013-05-03 MED ORDER — LACTATED RINGERS IV SOLN: 500.0000 mL | INTRAVENOUS | Status: DC | PRN

## 2013-05-03 MED ORDER — DIPHENHYDRAMINE HCL 50 MG/ML IJ SOLN: 12.5000 mg | INTRAMUSCULAR | Status: DC | PRN

## 2013-05-03 MED ORDER — ONDANSETRON HCL 4 MG PO TABS
4.0000 mg | ORAL_TABLET | ORAL | Status: DC | PRN
  Administered 2013-05-04 – 2013-05-05 (×2): 4 mg via ORAL
  Filled 2013-05-03 (×2): qty 1

## 2013-05-03 MED ORDER — ZOLPIDEM TARTRATE 5 MG PO TABS: 5.0000 mg | ORAL_TABLET | Freq: Every evening | ORAL | Status: DC | PRN

## 2013-05-03 MED ORDER — ACETAMINOPHEN 325 MG PO TABS: 650.0000 mg | ORAL_TABLET | ORAL | Status: DC | PRN

## 2013-05-03 MED ORDER — FENTANYL 2.5 MCG/ML BUPIVACAINE 1/10 % EPIDURAL INFUSION (WH - ANES)
14.0000 mL/h | INTRAMUSCULAR | Status: DC | PRN
  Filled 2013-05-03: qty 125

## 2013-05-03 MED ORDER — FAMOTIDINE 20 MG PO TABS
20.0000 mg | ORAL_TABLET | Freq: Every day | ORAL | Status: DC
  Administered 2013-05-04: 20 mg via ORAL
  Filled 2013-05-03: qty 1

## 2013-05-03 NOTE — Lactation Note (Signed)
This note was copied from the chart of Latoya Weiss. Lactation Consultation Note  Patient Name: Latoya Weiss [id number] Date: 05/03/2013 Reason for consult: Initial assessment of this primipara and her newborn, just 5 hours of age.  Baby is awake and showing some feeding cues but Pediatrician arrived and when [facility] returned, FOB holding baby.  LC encouraged STS and hand expression to encourage baby to breastfeed.  Parents attended prenatal BF class at [facility].  LC encouraged review of Baby and Me pp 9, 14 and 20-25 for STS and BF information. LC provided [employer] Resource brochure and reviewed [facility] services and list of community and web site resources.    Maternal Data Formula Feeding for Exclusion: No Infant to breast within first hour of birth: Yes Has patient been taught Hand Expression?: Yes (mom states her nurse has shown her hand expression) Does the patient have breastfeeding experience prior to this delivery?: No  Feeding Feeding Type: Breast Fed Length of feed: 5 min  LATCH Score/Interventions           no latch observed at this time           Lactation Tools Discussed/Used   STS, hand expression, cue feedings  Consult Status Consult Status: Follow-up Date: 05/04/13 Follow-up type: In-patient    Junious Dresser [facility] 05/03/2013, 7:35 PM

## 2013-05-03 NOTE — Progress Notes (Signed)
Patient ID: Latoya Weiss, female   DOB: 1975-11-24, 38 y.o.   MRN: [id number] Pitocin at 14 mu/ minute and the contractions are painful and q 2-3 minutes. The cervix is 3 cm 100 % effaced and the vertex at - 2 station. Presentation confirmed by [place] Fluid clear

## 2013-05-03 NOTE — Lactation Note (Signed)
This note was copied from the chart of Latoya [name] Avery. Lactation Consultation Note  Patient Name: Latoya Weiss [id number] Date: 05/03/2013 Reason for consult: Follow-up assessment;Difficult latch.  Baby has been fussy at intervals but is unable to achieve sustained latch.  She has had some spitty behavior at some attempts and at this visit, she will grasp areola but no sucking noted.  LC suggested trying NS and with #20 NS, she grasped areola and did suck a few times before falling asleep.  FOB present and shown how to assist.  Calming techniques and chin tug, as well as use of NS and cleaning reviewed.  Baby was in football position on mom's right breast during this attempt.   Maternal Data Formula Feeding for Exclusion: No Infant to breast within first hour of birth: Yes Has patient been taught Hand Expression?: Yes (mom states her nurse has shown her hand expression) Does the patient have breastfeeding experience prior to this delivery?: No  Feeding Feeding Type: Breast Fed  LATCH Score/Interventions Latch: Too sleepy or reluctant, no latch achieved, no sucking elicited. (baby fussy, grasps areola and a few sucks then asleep) Intervention(s): Skin to skin;Teach feeding cues (calming techniques, chin tug, tried w/#20 NS)  Audible Swallowing: None  Type of Nipple: Everted at rest and after stimulation (short nipples, firm breast tissue)  Comfort (Breast/Nipple): Soft / non-tender     Hold (Positioning): Assistance needed to correctly position infant at breast and maintain latch. (FOB shown how to assist) Intervention(s): Breastfeeding basics reviewed;Support Pillows;Position options;Skin to skin  LATCH Score: 5 (using #20 NS and LC assisting)  Lactation Tools Discussed/Used Tools: Nipple Shields Nipple shield size: 20   Consult Status Consult Status: Follow-up Date: 05/04/13 Follow-up type: In-patient    Junious Dresser [facility] 05/03/2013, 10:34 PM

## 2013-05-03 NOTE — Progress Notes (Signed)
Patient ID: Latoya Weiss, female   DOB: November 07, 1975, 38 y.o.   MRN: [id number] Delivery note:   The pt reached 4-5 cm dilated and an epidural was requested but before it could be given she became fully dilated and pushed well to deliver spontaneously LOA over an intact perineum a living female infant with Apgars of 8 and 9 at 1 and 5 minutes. The placenta was intact and the uterus was normal. There was a small left labial laceration that was hemostatic and not repaired. EBL 400 cc's.

## 2013-05-04 LAB — CBC
HCT: 33 % — ABNORMAL LOW (ref 36.0–46.0)
Hemoglobin: 11.3 g/dL — ABNORMAL LOW (ref 12.0–15.0)
MCH: 32.2 pg (ref 26.0–34.0)
MCHC: 34.2 g/dL (ref 30.0–36.0)
MCV: 94 fL (ref 78.0–100.0)
PLATELETS: 176 10*3/uL (ref 150–400)
RBC: 3.51 MIL/uL — ABNORMAL LOW (ref 3.87–5.11)
RDW: 13.7 % (ref 11.5–15.5)
WBC: 15.9 10*3/uL — ABNORMAL HIGH (ref 4.0–10.5)

## 2013-05-04 NOTE — Progress Notes (Signed)
Patient ID: Latoya Weiss, female   DOB: [birth year], 38 y.o.   MRN: [id number] #1 afebrile BP normal no complaints HGB stable

## 2013-05-04 NOTE — Progress Notes (Signed)

## 2013-05-05 MED ORDER — IBUPROFEN 600 MG PO TABS: 600.0000 mg | ORAL_TABLET | Freq: Four times a day (QID) | ORAL | Status: DC | PRN

## 2013-05-05 MED ORDER — OXYCODONE-ACETAMINOPHEN 5-325 MG PO TABS: 1.0000 | ORAL_TABLET | ORAL | Status: DC | PRN

## 2013-05-05 NOTE — Progress Notes (Signed)
Patient ID: Latoya Weiss, female   DOB: October 03, 1975, 38 y.o.   MRN: [id number] #[id number] afebrile BP normal no complaints

## 2013-05-05 NOTE — Discharge Summary (Signed)
Latoya Weiss, [name]                   ACCOUNT NO.:  [id number]  MEDICAL RECORD NO.:  [id number]  LOCATION:  9143                          FACILITY:  [facility]  PHYSICIAN:  Lucille Passy. Ulanda Edison, M.D. DATE OF BIRTH:  17-Jan-1976  DATE OF ADMISSION:  05/03/2013 DATE OF DISCHARGE:  05/05/2013                              DISCHARGE SUMMARY   This is a 38 year old white female, para 0, gravida 1 at 41+ weeks' gestation admitted for induction of labor.  The patient had positive group B strep.  She was followed during pregnancy because of possible small for dates and nonstress tests were all reactive.  On admission to the hospital, she was placed on penicillin and after several hours of penicillin, she was placed on Pitocin.  She developed a labor pattern and at 12:40 p.m., the Pitocin was 14 milliunits a minute.  Contractions were painful and every 2-3 minutes.  Cervix was 3 cm, 100%, vertex at - 2.  Artificial rupture of membranes produced clear fluid.  The patient reached 4-5 cm dilatation and an epidural was requested, but before it could be given she became fully dilated and pushed well to deliver spontaneously LOA over an intact perineum by Dr. Ulanda Edison, a living female infant.  Apgars of 8 and 9 at one and five minutes.  Placenta was intact.  Uterus normal.  Small left labial laceration was hemostatic and not repaired.  Postpartum, the patient did well and was discharged on the second postpartum day.  Initial hemoglobin 12.8, hematocrit 36.4, white count 10,900, platelet count 197,000.  Followup hemoglobin 11.3. RPR nonreactive.  FINAL DIAGNOSES:  Intrauterine pregnancy, 41+ weeks, delivered vertex.  OPERATION:  Spontaneous delivery vertex.  FINAL CONDITION:  Improved.  INSTRUCTIONS:  Include our regular discharge instruction booklet as well as the after visit summary.  She is to resume her prenatal vitamin pills and her Wellbutrin.  She is to take Percocet 5/325 one every 6 hours as needed for  pain and Motrin 600 mg 30 tablets, 1 every 6 hours as needed for pain.  She is to return to the office in 6 weeks for followup examination.     Lucille Passy. Ulanda Edison, M.D.     TFH/MEDQ  D:  05/05/2013  T:  05/05/2013  Job:  [id number]

## 2013-05-05 NOTE — Discharge Instructions (Signed)
booklet °

## 2013-06-11 ENCOUNTER — Inpatient Hospital Stay (HOSPITAL_COMMUNITY)
Admission: AD | Admit: 2013-06-11 | Discharge: 2013-06-11 | Disposition: A | Discharge status: Home / Self Care | Payer: 59 | Source: Ambulatory Visit | Attending: Obstetrics and Gynecology | Admitting: Obstetrics and Gynecology

## 2013-06-11 ENCOUNTER — Encounter (HOSPITAL_COMMUNITY): Payer: Self-pay | Admitting: *Deleted

## 2013-06-11 DIAGNOSIS — N644 Mastodynia: Secondary | ICD-10-CM | POA: Insufficient documentation

## 2013-06-11 DIAGNOSIS — O9122 Nonpurulent mastitis associated with the puerperium: Secondary | ICD-10-CM | POA: Insufficient documentation

## 2013-06-11 DIAGNOSIS — N61 Mastitis without abscess: Secondary | ICD-10-CM

## 2013-06-11 LAB — CBC
HCT: 39.4 % (ref 36.0–46.0)
Hemoglobin: 13.7 g/dL (ref 12.0–15.0)
MCH: 32.3 pg (ref 26.0–34.0)
MCHC: 34.8 g/dL (ref 30.0–36.0)
MCV: 92.9 fL (ref 78.0–100.0)
PLATELETS: 203 10*3/uL (ref 150–400)
RBC: 4.24 MIL/uL (ref 3.87–5.11)
RDW: 12.4 % (ref 11.5–15.5)
WBC: 14.3 10*3/uL — ABNORMAL HIGH (ref 4.0–10.5)

## 2013-06-11 MED ORDER — OXYCODONE-ACETAMINOPHEN 5-325 MG PO TABS
2.0000 | ORAL_TABLET | Freq: Once | ORAL | Status: AC
  Administered 2013-06-11: 2 via ORAL
  Filled 2013-06-11: qty 2

## 2013-06-11 MED ORDER — DICLOXACILLIN SODIUM 500 MG PO CAPS: 500.0000 mg | ORAL_CAPSULE | Freq: Three times a day (TID) | ORAL | Status: DC

## 2013-06-11 MED ORDER — DICLOXACILLIN SODIUM 500 MG PO CAPS
500.0000 mg | ORAL_CAPSULE | Freq: Once | ORAL | Status: AC
  Administered 2013-06-11: 500 mg via ORAL
  Filled 2013-06-11: qty 1

## 2013-06-11 NOTE — MAU Note (Signed)
Patient states she had a vaginal delivery on 3-6. States she had sudden onset of fever, chills and pain in the right breast. Temp was 102.

## 2013-06-11 NOTE — MAU Provider Note (Signed)
History     CSN: [id number]  Arrival date and time: 06/11/13 1807   First Provider Initiated Contact with Patient 06/11/13 1945      Chief Complaint  Patient presents with  . Breast Pain  . Fever   HPI  Latoya Weiss is a 38 y.o.female post partum delivered on 05/03/2013 who presents with right breast pain and fever that started this past weekend. She used a heat pack, massage and felt the symptoms got better. The patient is achy, has a HA and feels like she has the flu. She took ibuprofen around 1600; her fever got as high as 102.0. She is pumping every 4 hours; at times she does go 5-6 hours without pumping. The baby is taking formula as well.   OB History   Grav Para Term Preterm Abortions TAB SAB Ect Mult Living   1 1 1       1       Past Medical History  Diagnosis Date  . Anxiety   . Depression   . Herpes   . 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 Post-dates pregnancy 05/02/2013    Past Surgical History  Procedure Laterality Date  . Wrist surgery    . Ankle surgery    . Wisdom tooth extraction    . Fracture surgery      Family History  Problem Relation Age of Onset  . Anxiety disorder Mother   . Anxiety disorder Maternal Grandmother   . Diabetes Maternal Grandmother     History  Substance Use Topics  . Smoking status: Never Smoker   . Smokeless tobacco: Never Used  . Alcohol Use: No    Allergies: No Known Allergies  Prescriptions prior to admission  Medication Sig Dispense Refill  . Azelaic Acid (FINACEA) 15 % cream Apply 1 application topically daily. After skin is thoroughly washed and patted dry, gently but thoroughly massage a thin film of azelaic acid cream into the affected area twice daily, in the morning and evening.      Marland Kitchen buPROPion (WELLBUTRIN XL) 300 MG 24 hr tablet TAKE 1 TABLET BY MOUTH DAILY.  90 tablet  0  . desonide (DESOWEN) 0.05 % cream Apply 1 application topically daily.       Marland Kitchen ibuprofen (ADVIL,MOTRIN) 200 MG tablet Take 400  mg by mouth every 6 (six) hours as needed for headache.      . Prenatal Vit-Fe Fumarate-FA (PRENATAL MULTIVITAMIN) TABS tablet Take 1 tablet by mouth at bedtime.       . vitamin B-12 (CYANOCOBALAMIN) 100 MCG tablet Take 100 mcg by mouth daily.       Results for orders placed during the hospital encounter of 06/11/13 (from the past 48 hour(s))  CBC     Status: Abnormal   Collection Time    06/11/13  7:19 PM      Result Value Ref Range   WBC 14.3 (*) 4.0 - 10.5 K/uL   RBC 4.24  3.87 - 5.11 MIL/uL   Hemoglobin 13.7  12.0 - 15.0 g/dL   HCT 39.4  36.0 - 46.0 %   MCV 92.9  78.0 - 100.0 fL   MCH 32.3  26.0 - 34.0 pg   MCHC 34.8  30.0 - 36.0 g/dL   RDW 12.4  11.5 - 15.5 %   Platelets 203  150 - 400 K/uL    Review of Systems  Constitutional: Positive for fever, chills and malaise/fatigue.  Skin:       Right breast  pain with redness; tender to touch.   Neurological: Positive for headaches.   Physical Exam   Blood pressure 110/72, pulse 133, temperature 100.1 F (37.8 C), temperature source Oral, resp. rate 16, SpO2 98.00%, currently breastfeeding.  Physical Exam  Constitutional: She is oriented to person, place, and time. She appears well-developed and well-nourished. No distress.  Neck: Neck supple.  Respiratory: She exhibits tenderness. Right breast exhibits skin change and tenderness. Right breast exhibits no inverted nipple, no mass and no nipple discharge. Breasts are symmetrical.    Erythema and red streaks noted at 9 o'clock of the right breast, tender to touch, swollen, skin is taunt and shiny.  Neurological: She is alert and oriented to person, place, and time.  Skin: Skin is warm. She is not diaphoretic.  Psychiatric: Her behavior is normal.    MAU Course  Procedures None  MDM CBC Consulted with Dr. Marvel Plan  All purpose nipple cream called into [facility].   Assessment and Plan   A:  1. Mastitis, right, acute     P:  Discharge home in stable  condition  RX: dicloxacillin 500 mg TID         All purpose nipple cream called to [facility] Pt to call the office if symptoms have not started to improve in 24-48 hours Pump more frequently Warm compresses/ ice is ok Ibuprofen as directed on the bottle as needed.   Darrelyn Hillock Rasch, NP 06/11/2013, 7:45 PM

## 2013-08-26 ENCOUNTER — Other Ambulatory Visit: Payer: Self-pay | Admitting: Physician Assistant

## 2013-08-28 ENCOUNTER — Ambulatory Visit (INDEPENDENT_AMBULATORY_CARE_PROVIDER_SITE_OTHER): Payer: 59 | Admitting: Family Medicine

## 2013-08-28 VITALS — BP 102/67 | HR 90 | Temp 97.8°F | Resp 18 | Ht 67.0 in | Wt 135.0 lb

## 2013-08-28 DIAGNOSIS — J029 Acute pharyngitis, unspecified: Secondary | ICD-10-CM

## 2013-08-28 LAB — CBC
HCT: 41.8 % (ref 36.0–46.0)
Hemoglobin: 14 g/dL (ref 12.0–15.0)
MCH: 29.7 pg (ref 26.0–34.0)
MCHC: 33.5 g/dL (ref 30.0–36.0)
MCV: 88.6 fL (ref 78.0–100.0)
Platelets: 269 10*3/uL (ref 150–400)
RBC: 4.72 MIL/uL (ref 3.87–5.11)
RDW: 13 % (ref 11.5–15.5)
WBC: 11 10*3/uL — ABNORMAL HIGH (ref 4.0–10.5)

## 2013-08-28 LAB — POCT RAPID STREP A (OFFICE): Rapid Strep A Screen: NEGATIVE

## 2013-08-28 NOTE — Progress Notes (Signed)
[facility] [address], [place] [address] [phone]  Date:  08/28/2013   Name:  CHALESE PEACH   DOB:  1975/03/23   MRN:  [id number]  PCP:  No PCP Per Patient    Chief Complaint: Sore Throat   History of Present Illness:  Latoya Weiss is a 38 y.o. very pleasant female patient who presents with the following:  She is here today with a ST for about 3 days.  She has not noted a fever, and has checked her temperature.   She has had a cough, no runny nose, has had some sinus congestion.   No body aches or chills, but she has had a headache.   She did also notice some posterior cervical LAD a few days ago No GI symptoms but her appetite is poor. She is otherwise generally healthy.  She delivered a few month ago, and her daughter has been a bit congested as well.  She is no longer breastfeeding her baby.   LMP was about 10 days ago  Patient Active Problem List   Diagnosis Date Noted  . SVD (spontaneous vaginal delivery) 05/03/2013  . 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 Post-dates pregnancy 05/02/2013  . Toe pain, right 04/03/2013  . Normal pregnancy, first 09/11/2012  . Patient undergoing in vitro fertilization 07/12/2012  . Foot pain, left 03/10/2011  . Ankle pain, left 12/20/2010    Past Medical History  Diagnosis Date  . Anxiety   . Depression   . Herpes   . 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 Post-dates pregnancy 05/02/2013    Past Surgical History  Procedure Laterality Date  . Wrist surgery    . Ankle surgery    . Wisdom tooth extraction    . Fracture surgery      History  Substance Use Topics  . Smoking status: Never Smoker   . Smokeless tobacco: Never Used  . Alcohol Use: No    Family History  Problem Relation Age of Onset  . Anxiety disorder Mother   . Anxiety disorder Maternal Grandmother   . Diabetes Maternal Grandmother     No Known Allergies  Medication list has been reviewed and updated.  Current  Outpatient Prescriptions on File Prior to Visit  Medication Sig Dispense Refill  . Azelaic Acid (FINACEA) 15 % cream Apply 1 application topically daily. After skin is thoroughly washed and patted dry, gently but thoroughly massage a thin film of azelaic acid cream into the affected area twice daily, in the morning and evening.      Marland Kitchen buPROPion (WELLBUTRIN XL) 300 MG 24 hr tablet TAKE 1 TABLET BY MOUTH DAILY.  90 tablet  PRN  . desonide (DESOWEN) 0.05 % cream Apply 1 application topically daily.       . dicloxacillin (DYNAPEN) 500 MG capsule Take 1 capsule (500 mg total) by mouth 3 (three) times daily.  30 capsule  0  . ibuprofen (ADVIL,MOTRIN) 200 MG tablet Take 400 mg by mouth every 6 (six) hours as needed for headache.      . Prenatal Vit-Fe Fumarate-FA (PRENATAL MULTIVITAMIN) TABS tablet Take 1 tablet by mouth at bedtime.       . vitamin B-12 (CYANOCOBALAMIN) 100 MCG tablet Take 100 mcg by mouth daily.       No current facility-administered medications on file prior to visit.    Review of Systems:  As per HPI- otherwise negative.   Physical Examination: Filed Vitals:   08/28/13  0817  BP: 102/67  Pulse: 106  Temp: 97.8 F (36.6 C)  Resp: 18   Filed Vitals:   08/28/13 0817  Height: 5\' 7"  (1.702 m)  Weight: 135 lb (61.236 kg)   Body mass index is 21.14 kg/(m^2). Ideal Body Weight: Weight in (lb) to have BMI = 25: 159.3  GEN: WDWN, NAD, Non-toxic, A & O x 3, looks well HEENT: Atraumatic, Normocephalic. Neck supple. No masses, No LAD.  Bilateral TM wnl, oropharynx normal.  PEERL,EOMI.   Ears and Nose: No external deformity. CV: RRR, No M/G/R. No JVD. No thrill. No extra heart sounds. PULM: CTA B, no wheezes, crackles, rhonchi. No retractions. No resp. distress. No accessory muscle use. ABD: S, NT, ND, +BS. No rebound. No HSM. EXTR: No c/c/e NEURO Normal gait.  PSYCH: Normally interactive. Conversant. Not depressed or anxious appearing.  Calm demeanor.    Assessment and  Plan: Acute pharyngitis, unspecified pharyngitis type - Plan: POCT rapid strep A, Culture, Group A Strep, CBC, Epstein-Barr virus VCA antibody panel, CANCELED: Culture, Group A Strep  Likely viral pharyngitis.  She specifically complained of posterior cervical nodes (now resolved) so will check EBV.   Will plan further follow- up pending labs.   Results for orders placed in visit on 08/28/13  CBC      Result Value Ref Range   WBC 11.0 (*) 4.0 - 10.5 K/uL   RBC 4.72  3.87 - 5.11 MIL/uL   Hemoglobin 14.0  12.0 - 15.0 g/dL   HCT 41.8  36.0 - 46.0 %   MCV 88.6  78.0 - 100.0 fL   MCH 29.7  26.0 - 34.0 pg   MCHC 33.5  30.0 - 36.0 g/dL   RDW 13.0  11.5 - 15.5 %   Platelets 269  150 - 400 K/uL  EPSTEIN-BARR VIRUS VCA ANTIBODY PANEL      Result Value Ref Range   EBV VCA IgG    <18.0 U/mL   EBV VCA IgM    <36.0 U/mL   EBV EA IgG    <9.0 U/mL   EBV NA IgG    <18.0 U/mL  POCT RAPID STREP A (OFFICE)      Result Value Ref Range   Rapid Strep A Screen Negative  Negative     Signed Lamar Blinks, MD

## 2013-08-28 NOTE — Patient Instructions (Signed)
I will be in touch with the rest of your labs. You may have mono or another viral infection.  Take it easy until we get your results back, and use OTC medication as needed for your symptoms.    Do NOT take amoxicillin as this may cause a rash if you do have mono

## 2013-08-29 ENCOUNTER — Encounter: Payer: Self-pay | Admitting: Family Medicine

## 2013-08-29 LAB — EPSTEIN-BARR VIRUS VCA ANTIBODY PANEL
EBV EA IgG: 12.1 U/mL — ABNORMAL HIGH (ref <9.0)
EBV NA IgG: >600 U/mL — ABNORMAL HIGH (ref <18.0)
EBV VCA IgG: >750 U/mL — ABNORMAL HIGH (ref <18.0)
EBV VCA IgM: <10 U/mL (ref <36.0)

## 2013-08-30 ENCOUNTER — Encounter: Payer: Self-pay | Admitting: Family Medicine

## 2013-08-30 DIAGNOSIS — J019 Acute sinusitis, unspecified: Secondary | ICD-10-CM

## 2013-08-30 LAB — CULTURE, GROUP A STREP: Organism ID, Bacteria: NORMAL

## 2013-08-30 MED ORDER — CEFDINIR 300 MG PO CAPS
300.0000 mg | ORAL_CAPSULE | Freq: Two times a day (BID) | ORAL | Status: DC
Start: 2013-08-30 — End: 2014-08-15

## 2013-08-30 NOTE — Telephone Encounter (Signed)
Received her message and called her back- she states she is still ill. Last night she had a hard time with coughing a lot- the cough is now productive.  She has not noted a fever,  But has just felt bad overall.  She did check her temp yesterday and it was ok.  She still has a lot of sinus pressure and congestion as well  Will treat with omnicef at this point.  She will let me know if not better soon- Sooner if worse.   Meds ordered this encounter  Medications  . cefdinir (OMNICEF) 300 MG capsule    Sig: Take 1 capsule (300 mg total) by mouth 2 (two) times daily.    Dispense:  20 capsule    Refill:  0

## 2014-01-15 ENCOUNTER — Ambulatory Visit: Payer: Self-pay | Admitting: Internal Medicine

## 2014-01-21 LAB — OB RESULTS CONSOLE RPR: RPR: NONREACTIVE

## 2014-01-21 LAB — OB RESULTS CONSOLE ABO/RH: RH Type: POSITIVE

## 2014-01-21 LAB — OB RESULTS CONSOLE ANTIBODY SCREEN: Antibody Screen: NEGATIVE

## 2014-01-21 LAB — OB RESULTS CONSOLE RUBELLA ANTIBODY, IGM: Rubella: IMMUNE

## 2014-01-21 LAB — OB RESULTS CONSOLE HEPATITIS B SURFACE ANTIGEN: Hepatitis B Surface Ag: NEGATIVE

## 2014-01-21 LAB — OB RESULTS CONSOLE HIV ANTIBODY (ROUTINE TESTING): HIV: NONREACTIVE

## 2014-02-28 NOTE — L&D Delivery Note (Signed)
Delivery Note  First Stage: Labor onset: 2330 SROM at 2330 and regular ctx  Admit @ 0130 Rapid progress to complete dilation while awaiting epidural  Second Stage: Complete dilation at 0150 Onset of pushing at 0150 FHR second stage category 1  Delivery of a viable female at 79 by CNM in LOA position no nuchal cord Cord double clamped after cessation of pulsation, cut by FOB Cord blood sample collected   Marked edema of redundant tissue after delivery (4cm length x 3cm width x 2cm thickness) Small superficial abrasions across top of tissue - no evidence of hematoma   Third Stage: Placenta delivered Shulta intact with 3 VC @ 0206 Placenta disposition: for hospital disposal Uterine tone firm / bleeding small  No perineal or vaginal laceration identified  Anesthesia for repair: 1% xylocaine local to margins of redundant tissue Excision of redundant tissue / no active bleeding /no hematoma / sent to pathology  ?? Etiology: keloid versus excess laxed perineal tissue from previous delivery  3-0 vicryl rapide for perineal approximation followed by 4-0 vicryl subcuticular repair Est. Blood Loss (mL): 111  Complications: none  Mom to postpartum.  Baby to Couplet care / Skin to Skin.  Newborn: Birth Weight: 8 pounds - 2 ounces Apgar Scores: 8-10 Feeding planned: breast  Artelia Laroche CNM, MSN, [place] 08/15/2014, 2:42 AM

## 2014-07-22 LAB — OB RESULTS CONSOLE GBS: STREP GROUP B AG: NEGATIVE

## 2014-08-15 ENCOUNTER — Encounter (HOSPITAL_COMMUNITY): Payer: Self-pay

## 2014-08-15 ENCOUNTER — Inpatient Hospital Stay (HOSPITAL_COMMUNITY): Payer: 59 | Admitting: Anesthesiology

## 2014-08-15 ENCOUNTER — Inpatient Hospital Stay (HOSPITAL_COMMUNITY)
Admission: AD | Admit: 2014-08-15 | Discharge: 2014-08-16 | DRG: 775 | Disposition: A | Discharge status: Home / Self Care | Payer: 59 | Source: Ambulatory Visit | Attending: Obstetrics and Gynecology | Admitting: Obstetrics and Gynecology

## 2014-08-15 DIAGNOSIS — O09523 Supervision of elderly multigravida, third trimester: Secondary | ICD-10-CM | POA: Diagnosis present

## 2014-08-15 DIAGNOSIS — Z3A38 38 weeks gestation of pregnancy: Secondary | ICD-10-CM | POA: Diagnosis present

## 2014-08-15 DIAGNOSIS — F329 Major depressive disorder, single episode, unspecified: Secondary | ICD-10-CM | POA: Diagnosis present

## 2014-08-15 DIAGNOSIS — N898 Other specified noninflammatory disorders of vagina: Secondary | ICD-10-CM | POA: Diagnosis present

## 2014-08-15 DIAGNOSIS — Z833 Family history of diabetes mellitus: Secondary | ICD-10-CM | POA: Diagnosis not present

## 2014-08-15 DIAGNOSIS — O99344 Other mental disorders complicating childbirth: Principal | ICD-10-CM | POA: Diagnosis present

## 2014-08-15 LAB — CBC
HCT: 38.1 % (ref 36.0–46.0)
HCT: 34.9 % — ABNORMAL LOW (ref 36.0–46.0)
Hemoglobin: 13.3 g/dL (ref 12.0–15.0)
Hemoglobin: 11.9 g/dL — ABNORMAL LOW (ref 12.0–15.0)
MCH: 31.2 pg (ref 26.0–34.0)
MCH: 30.9 pg (ref 26.0–34.0)
MCHC: 34.9 g/dL (ref 30.0–36.0)
MCHC: 34.1 g/dL (ref 30.0–36.0)
MCV: 89.4 fL (ref 78.0–100.0)
MCV: 90.6 fL (ref 78.0–100.0)
Platelets: 209 10*3/uL (ref 150–400)
Platelets: 189 10*3/uL (ref 150–400)
RBC: 4.26 MIL/uL (ref 3.87–5.11)
RBC: 3.85 MIL/uL — ABNORMAL LOW (ref 3.87–5.11)
RDW: 13.8 % (ref 11.5–15.5)
RDW: 13.9 % (ref 11.5–15.5)
WBC: 10.8 10*3/uL — ABNORMAL HIGH (ref 4.0–10.5)
WBC: 19.2 10*3/uL — ABNORMAL HIGH (ref 4.0–10.5)

## 2014-08-15 LAB — TYPE AND SCREEN
ABO/RH(D): O POS
Antibody Screen: NEGATIVE

## 2014-08-15 LAB — ABO/RH: ABO/RH(D): O POS

## 2014-08-15 LAB — RPR: RPR Ser Ql: NONREACTIVE

## 2014-08-15 MED ORDER — OXYCODONE-ACETAMINOPHEN 5-325 MG PO TABS: 1.0000 | ORAL_TABLET | ORAL | Status: DC | PRN

## 2014-08-15 MED ORDER — OXYCODONE-ACETAMINOPHEN 5-325 MG PO TABS: 2.0000 | ORAL_TABLET | ORAL | Status: DC | PRN

## 2014-08-15 MED ORDER — CITRIC ACID-SODIUM CITRATE 334-500 MG/5ML PO SOLN: 30.0000 mL | ORAL | Status: DC | PRN

## 2014-08-15 MED ORDER — ONDANSETRON HCL 4 MG/2ML IJ SOLN: 4.0000 mg | Freq: Four times a day (QID) | INTRAMUSCULAR | Status: DC | PRN

## 2014-08-15 MED ORDER — LACTATED RINGERS IV SOLN
INTRAVENOUS | Status: DC
  Administered 2014-08-15: 02:00:00 via INTRAVENOUS

## 2014-08-15 MED ORDER — LIDOCAINE HCL (PF) 1 % IJ SOLN
30.0000 mL | INTRAMUSCULAR | Status: DC | PRN
  Administered 2014-08-15: 30 mL via SUBCUTANEOUS
  Filled 2014-08-15: qty 30

## 2014-08-15 MED ORDER — LIDOCAINE HCL (PF) 1 % IJ SOLN
INTRAMUSCULAR | Status: AC
  Administered 2014-08-15: 30 mL via SUBCUTANEOUS
  Filled 2014-08-15: qty 30

## 2014-08-15 MED ORDER — SENNOSIDES-DOCUSATE SODIUM 8.6-50 MG PO TABS
2.0000 | ORAL_TABLET | ORAL | Status: DC
  Administered 2014-08-15: 2 via ORAL
  Filled 2014-08-15: qty 2

## 2014-08-15 MED ORDER — OXYCODONE-ACETAMINOPHEN 5-325 MG PO TABS
1.0000 | ORAL_TABLET | ORAL | Status: DC | PRN
  Administered 2014-08-15 (×2): 1 via ORAL
  Filled 2014-08-15 (×2): qty 1

## 2014-08-15 MED ORDER — OXYTOCIN 40 UNITS IN LACTATED RINGERS INFUSION - SIMPLE MED: 62.5000 mL/h | INTRAVENOUS | Status: DC

## 2014-08-15 MED ORDER — IBUPROFEN 800 MG PO TABS
800.0000 mg | ORAL_TABLET | Freq: Three times a day (TID) | ORAL | Status: DC
  Administered 2014-08-15: 800 mg via ORAL
  Filled 2014-08-15: qty 1

## 2014-08-15 MED ORDER — NAPROXEN 500 MG PO TABS
500.0000 mg | ORAL_TABLET | Freq: Two times a day (BID) | ORAL | Status: DC
  Administered 2014-08-15 – 2014-08-16 (×2): 500 mg via ORAL
  Filled 2014-08-15 (×4): qty 1

## 2014-08-15 MED ORDER — BUPROPION HCL ER (XL) 300 MG PO TB24
300.0000 mg | ORAL_TABLET | Freq: Every day | ORAL | Status: DC
  Administered 2014-08-15: 300 mg via ORAL
  Filled 2014-08-15 (×2): qty 1

## 2014-08-15 MED ORDER — NALBUPHINE HCL 10 MG/ML IJ SOLN: 5.0000 mg | INTRAMUSCULAR | Status: DC | PRN

## 2014-08-15 MED ORDER — PHENYLEPHRINE 40 MCG/ML (10ML) SYRINGE FOR IV PUSH (FOR BLOOD PRESSURE SUPPORT)
PREFILLED_SYRINGE | INTRAVENOUS | Status: AC
  Filled 2014-08-15: qty 10

## 2014-08-15 MED ORDER — TRAMADOL HCL 50 MG PO TABS
50.0000 mg | ORAL_TABLET | Freq: Four times a day (QID) | ORAL | Status: DC
  Administered 2014-08-15 – 2014-08-16 (×5): 50 mg via ORAL
  Filled 2014-08-15 (×5): qty 1

## 2014-08-15 MED ORDER — FENTANYL 2.5 MCG/ML BUPIVACAINE 1/10 % EPIDURAL INFUSION (WH - ANES)
INTRAMUSCULAR | Status: AC
  Filled 2014-08-15: qty 125

## 2014-08-15 MED ORDER — NAPROXEN SODIUM 550 MG PO TABS
550.0000 mg | ORAL_TABLET | Freq: Two times a day (BID) | ORAL | Status: DC
  Filled 2014-08-15 (×2): qty 1

## 2014-08-15 MED ORDER — BENZOCAINE-MENTHOL 20-0.5 % EX AERO
1.0000 "application " | INHALATION_SPRAY | CUTANEOUS | Status: DC | PRN
  Administered 2014-08-15: 1 via TOPICAL
  Filled 2014-08-15 (×2): qty 56

## 2014-08-15 MED ORDER — BUPROPION HCL ER (XL) 300 MG PO TB24
300.0000 mg | ORAL_TABLET | Freq: Every day | ORAL | Status: DC
  Filled 2014-08-15: qty 1

## 2014-08-15 MED ORDER — FENTANYL 2.5 MCG/ML BUPIVACAINE 1/10 % EPIDURAL INFUSION (WH - ANES): 14.0000 mL/h | INTRAMUSCULAR | Status: DC | PRN

## 2014-08-15 MED ORDER — OXYTOCIN BOLUS FROM INFUSION
500.0000 mL | INTRAVENOUS | Status: DC
  Administered 2014-08-15: 500 mL via INTRAVENOUS

## 2014-08-15 MED ORDER — LACTATED RINGERS IV SOLN: 500.0000 mL | INTRAVENOUS | Status: DC | PRN

## 2014-08-15 MED ORDER — LANOLIN HYDROUS EX OINT: TOPICAL_OINTMENT | CUTANEOUS | Status: DC | PRN

## 2014-08-15 MED ORDER — ACETAMINOPHEN 325 MG PO TABS: 650.0000 mg | ORAL_TABLET | ORAL | Status: DC | PRN

## 2014-08-15 MED ORDER — SILVER NITRATE-POT NITRATE 75-25 % EX MISC
1.0000 "application " | Freq: Once | CUTANEOUS | Status: DC
  Filled 2014-08-15: qty 1

## 2014-08-15 MED ORDER — DIBUCAINE 1 % RE OINT
1.0000 "application " | TOPICAL_OINTMENT | RECTAL | Status: DC | PRN
  Filled 2014-08-15: qty 28

## 2014-08-15 MED ORDER — OXYTOCIN 40 UNITS IN LACTATED RINGERS INFUSION - SIMPLE MED
INTRAVENOUS | Status: AC
  Filled 2014-08-15: qty 1000

## 2014-08-15 MED ORDER — WITCH HAZEL-GLYCERIN EX PADS: 1.0000 "application " | MEDICATED_PAD | CUTANEOUS | Status: DC | PRN

## 2014-08-15 NOTE — H&P (Signed)
OB ADMISSION/ HISTORY & PHYSICAL:  Admission Date: 08/15/2014 12:33 AM  Admit Diagnosis: 38 weeks SROM  Latoya Weiss is a 39 y.o. female presenting for onset of labor and SROM.  Prenatal History: G2P1001   EDC : 08/26/2014, by Other Basis  Prenatal care at [facility]  Primary Ob Provider: Julianne Handler CNM Prenatal course complicated by AMA / hx LEEP / depression - stable on Wellbutrin / hx HSV - suppression therapy late gestation / hx rapid labor Redundant vaginal/perineal tissue gapping at introitus - desires repair / revision with delivery  Prenatal Labs: ABO, Rh:  O positive Antibody:  negative Rubella:   Immune RPR:   NR HBsAg:  negative  HIV:   NR GTT: NL GBS:   negative  Medical / Surgical History :  Past medical history:  Past Medical History  Diagnosis Date  . Anxiety   . Depression   . Herpes   . 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 Post-dates pregnancy 05/02/2013     Past surgical history:  Past Surgical History  Procedure Laterality Date  . Wrist surgery    . Ankle surgery    . Wisdom tooth extraction    . Fracture surgery      Family History:  Family History  Problem Relation Age of Onset  . Anxiety disorder Mother   . Anxiety disorder Maternal Grandmother   . Diabetes Maternal Grandmother      Social History:  reports that she has never smoked. She has never used smokeless tobacco. She reports that she does not drink alcohol or use illicit drugs.   Allergies: Review of patient's allergies indicates no known allergies.    Current Medications at time of admission:  Prior to Admission medications   Medication Sig Start Date End Date Taking? Authorizing Provider  Azelaic Acid (FINACEA) 15 % cream Apply 1 application topically daily. After skin is thoroughly washed and patted dry, gently but thoroughly massage a thin film of azelaic acid cream into the affected area twice daily, in the morning and evening.    Historical  Provider, MD  buPROPion (WELLBUTRIN XL) 300 MG 24 hr tablet TAKE 1 TABLET BY MOUTH DAILY. 08/26/13   Collene Leyden, PA-C  cefdinir (OMNICEF) 300 MG capsule Take 1 capsule (300 mg total) by mouth 2 (two) times daily. 08/30/13   Gay Filler Copland, MD  desonide (DESOWEN) 0.05 % cream Apply 1 application topically daily.     Historical Provider, MD  dicloxacillin (DYNAPEN) 500 MG capsule Take 1 capsule (500 mg total) by mouth 3 (three) times daily. 06/11/13   Lezlie Lye, NP  ibuprofen (ADVIL,MOTRIN) 200 MG tablet Take 400 mg by mouth every 6 (six) hours as needed for headache.    Historical Provider, MD  Prenatal Vit-Fe Fumarate-FA (PRENATAL MULTIVITAMIN) TABS tablet Take 1 tablet by mouth at bedtime.     Historical Provider, MD  vitamin B-12 (CYANOCOBALAMIN) 100 MCG tablet Take 100 mcg by mouth daily.    Historical Provider, MD     Review of Systems: Active FM onset of ctx every 3-5 minutes LOF  / SROM @ bloody show present   Physical Exam:  VS: Last menstrual period 08/18/2013, currently breastfeeding.  General: alert and oriented, appears uncomfortable with ctx Heart: RRR Lungs: Clear lung fields Abdomen: Gravid, soft and non-tender, non-distended / uterus: gravid Extremities: no edema  Genitalia / VE:  5cm / 90% / vtx 0 station / vtx  FHR: baseline rate 140 / variability  moderate / accelerations + / no decelerations TOCO: Q3-5 minutes  Assessment: [redacted] weeks gestation active stage of labor FHR category 1   Plan:  Admit Expectant management - place epidural Anticipate SVB Revision / repair redundant vaginal tissue at introitus after delivery  Dr Ronita Hipps notified of admission / plan of care   Artelia Laroche CNM, MSN, [facility] 08/15/2014, 12:49 AM

## 2014-08-15 NOTE — Anesthesia Preprocedure Evaluation (Deleted)

## 2014-08-15 NOTE — Progress Notes (Signed)
CNM Bhambri called per pt request.  Pt reports taking Wellbutrin and Valtrex during pregnancy but she noticed that it had not been continued postpartum.  Pt reports taking Wellbutrin every evening.  CNM ordered Wellbutrin 300 mg to be taken daily at bedtime.  CNM reported that Valtrex does not need to continue post delivery. Ordered placed and will continue to monitor.

## 2014-08-15 NOTE — Progress Notes (Signed)
Interval note  States cramping is miserable Patient reports tailbone pain  - hx fractured tailbone last delivery Reassured may only be inflammation from baby delivery over tailbone and may not be re-fractured / no immediate pain at delivery or immediate PP - pain worse over past hour  Recommend ice pack - K-pad on cold setting to sacral region Will change analgesia to anaprox and Ultram for better pain coverage  Perineum minimal swelling - no bruising / repair site without edema Ice pack in place  [employer] CNM [facility]

## 2014-08-15 NOTE — Progress Notes (Signed)
MOB was referred for history of depression/anxiety. * Referral screened out by Clinical Social Worker because none of the following criteria appear to apply: ~ History of anxiety/depression during this pregnancy, or of post-partum depression. ~ Diagnosis of anxiety and/or depression within last 3 years OR * MOB's symptoms currently being treated with medication and/or therapy. Please contact the Clinical Social Worker if needs arise, or if MOB requests.   

## 2014-08-16 MED ORDER — TRAMADOL HCL 50 MG PO TABS: 50.0000 mg | ORAL_TABLET | Freq: Four times a day (QID) | ORAL | Status: DC | PRN

## 2014-08-16 MED ORDER — NAPROXEN 500 MG PO TABS: 500.0000 mg | ORAL_TABLET | Freq: Two times a day (BID) | ORAL | Status: DC

## 2014-08-16 NOTE — Progress Notes (Signed)
PPD 1 SVD  S:  Reports feeling better - less pain in tailbone with new meds             Tolerating po/ No nausea or vomiting             Bleeding is light             Pain controlled with anaprox and Ultram             Up ad lib / ambulatory / voiding QS  Newborn breast feeding  / Circumcision done O:               VS: BP 103/56 mmHg  Pulse 62  Temp(Src) 98 F (36.7 C) (Oral)  Resp 16  Ht 5\' 6"  (1.676 m)  Wt 73.936 kg (163 lb)  BMI 26.32 kg/m2  LMP 08/18/2013  Breastfeeding? Unknown   LABS:              Recent Labs  08/15/14 0100 08/15/14 0505  WBC 10.8* 19.2*  HGB 13.3 11.9*  PLT 209 189               Blood type: --/--/O POS, O POS (06/17 0100)  Rubella: Immune (11/24 0000)                      Physical Exam:             Alert and oriented X3  Abdomen: soft, non-tender, non-distended              Fundus: firm, non-tender, U-1  Perineum: no edema / no bruising  Lochia: light  Extremities: no edema, no calf pain or tenderness    A: PPD # 1   Doing well - stable status  P: Routine post partum orders   DC home - WOB booklet - instructions reviewed              Follow-up 2 weeks - recheck repair site / reassess tailbone pain              Recommend ice for sacral/ coccyx inflammation x 3 days                                     Tub soak / avoidance of prolonged sitting on tailbone / use of pillows  Artelia Laroche CNM, MSN, FACNM 08/16/2014, 12:21 PM

## 2014-08-16 NOTE — Discharge Summary (Signed)
Obstetric Discharge Summary  Reason for Admission: onset of labor Prenatal Procedures: none Intrapartum Procedures: spontaneous vaginal delivery and revision & repair perineal tissue Postpartum Procedures: none Complications-Operative and Postpartum: none HEMOGLOBIN  Date Value Ref Range Status  08/15/2014 11.9* 12.0 - 15.0 g/dL Final   HCT  Date Value Ref Range Status  08/15/2014 34.9* 36.0 - 46.0 % Final    Physical Exam:  General: alert, cooperative and no distress Lochia: appropriate Uterine Fundus: firm Incision: healing well DVT Evaluation: No evidence of DVT seen on physical exam.  Discharge Diagnoses: Term Pregnancy-delivered  Discharge Information: Date: 08/16/2014 Activity: pelvic rest Diet: routine Medications: PNV and wellbutrin and anaprox and ultram Condition: stable Instructions: refer to practice specific booklet Discharge to: home Follow-up Information    Follow up with Artelia Laroche, CNM. Schedule an appointment as soon as possible for a visit in 2 weeks.   Specialty:  Obstetrics and Gynecology   Contact information:   [place] [address] [phone]       Newborn Data: Live born female  Birth Weight: 8 lb 2 oz (3685 g) APGAR: 8, 10  Home with mother.  Artelia Laroche 08/16/2014, 12:28 PM

## 2014-08-16 NOTE — Lactation Note (Signed)
This note was copied from the chart of [facility]. Lactation Consultation Note  Upon entering the room parents refused consult. Mother wants to give baby colostrum and then wean baby. Has history of mastitis.  Briefly discussed engorgement care and mastitis prevention. Left lactation brochure.   Patient Name: Latoya Weiss [id number] Date: 08/16/2014     Maternal Data    Feeding Length of feed: 25 min  LATCH Score/Interventions Latch: Grasps breast easily, tongue down, lips flanged, rhythmical sucking.  Audible Swallowing: A few with stimulation  Type of Nipple: Everted at rest and after stimulation  Comfort (Breast/Nipple): Soft / non-tender     Hold (Positioning): Assistance needed to correctly position infant at breast and maintain latch.  LATCH Score: 8  Lactation Tools Discussed/Used Tools: Nipple [place] [employer]   Consult Status      [name] [facility] 08/16/2014, 12:24 PM

## 2014-08-21 NOTE — Anesthesia Postprocedure Evaluation (Deleted)
  Anesthesia Post-op Note This patient has recovered from her labor epidural, and I am not aware of any complications or problems.

## 2014-12-02 ENCOUNTER — Encounter: Payer: Self-pay | Admitting: Family Medicine

## 2014-12-02 ENCOUNTER — Ambulatory Visit (INDEPENDENT_AMBULATORY_CARE_PROVIDER_SITE_OTHER): Payer: 59 | Admitting: Family Medicine

## 2014-12-02 VITALS — BP 109/92 | HR 76 | Ht 67.0 in | Wt 142.0 lb

## 2014-12-02 DIAGNOSIS — R202 Paresthesia of skin: Secondary | ICD-10-CM

## 2014-12-02 DIAGNOSIS — M542 Cervicalgia: Secondary | ICD-10-CM | POA: Diagnosis not present

## 2014-12-02 DIAGNOSIS — R200 Anesthesia of skin: Secondary | ICD-10-CM

## 2014-12-02 NOTE — Progress Notes (Signed)
  Latoya Weiss - 39 y.o. female MRN [id number]  Date of birth: [birth year]  CC: b/l neck and arm pain  SUBJECTIVE:   HPI  Neck Pain since '04. Right handed.  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  - Having 67 month old has aggravated pain a little recently, especially feeing. Marland Kitchen Has an 34 month old as well.   - Numbness down both arms, usually starting in wrists and radiating proximally to shoulders - Does feel weak at times and is sometimes scared to carry her baby (once only). - No recent PT -  Reportedly has carpal tunnel. Not clinically diagnosed -- Began prior to '04, possibly '01 -- Tingling and numbness in b/l shoulder.  Flairs up randomly.  Worse post delivery, but this has improved.  - No night time pain - Does shake her hands out occasional  - No weakness in her pain.   - No splinting.  - NO injections. - Previous left dorsal radius surgery.  Injury 9/11, 11/11, 5/12.  - Neck pain worse when numbness is worse.  - Yoga helps her neck quite a bit.  - Currently on the right.  - NO trauma - Symptoms worsened 3rd trimester.    ROS:     12 point RoS negative other than that listed in HPI.  HISTORY: Past Medical, Surgical, Social, and Family History Reviewed & Updated per EMR.    OBJECTIVE: There were no vitals taken for this visit.  Physical Exam  Calm, NAD Speaking in full sentences.   Neck:b/l Negative spurling's Full neck range of motion Grip strength and sensation normal in bilateral hands Strength good C4 to T1 distribution No sensory change to C4 to T1 Reflexes normal  Shoulder:b/l B/l full RoM without RC weakness.   Elbow: b/l Full ROM.   Wrist:b/l Inspection normal with no visible erythema or swelling. There does appear to be thenar atrophy on the right.  ROM smooth and normal with good flexion and extension and ulnar/radial deviation that is symmetrical with opposite wrist. Palpation is normal over metacarpals, navicular,  lunate, and TFCC; tendons without tenderness/ swelling Strength 5/5 in all directions without pain. Negative Finkelstein, tinel's. + phalens b/l.  MEDICATIONS, LABS & OTHER ORDERS: Previous Medications   BUPROPION (WELLBUTRIN XL) 300 MG 24 HR TABLET    TAKE 1 TABLET BY MOUTH DAILY.   NAPROXEN (NAPROSYN) 500 MG TABLET    Take 1 tablet (500 mg total) by mouth 2 (two) times daily with a meal.   PRENATAL VIT-FE FUMARATE-FA (PRENATAL MULTIVITAMIN) TABS TABLET    Take 1 tablet by mouth at bedtime.    TRAMADOL (ULTRAM) 50 MG TABLET    Take 1 tablet (50 mg total) by mouth every 6 (six) hours as needed for moderate pain.   Modified Medications   No medications on file   New Prescriptions   No medications on file   Discontinued Medications   No medications on file  No orders of the defined types were placed in this encounter.   ASSESSMENT & PLAN: See problem based charting & AVS for pt instructions.

## 2014-12-02 NOTE — Patient Instructions (Signed)
Dr Shelda Altes EMG's [facility] [place] [address] Phone: [phone] Monday October 17th 2:30pm

## 2014-12-03 DIAGNOSIS — R200 Anesthesia of skin: Secondary | ICD-10-CM | POA: Insufficient documentation

## 2014-12-03 DIAGNOSIS — R202 Paresthesia of skin: Principal | ICD-10-CM

## 2014-12-03 DIAGNOSIS — M542 Cervicalgia: Secondary | ICD-10-CM | POA: Insufficient documentation

## 2014-12-03 NOTE — Assessment & Plan Note (Signed)
Intermittent neck pain for the last 10 years. B/l radiculopathy most consistent for carpal tunnel, but will r/o cervical radiculopathy.  Discussed posture and RoM exercises. After EMG will consider PT for neck.

## 2014-12-03 NOTE — Assessment & Plan Note (Signed)
B/l radiculopathy most consistent with carpal tunnel. + phalen and flick.  Negative Tinel. Lots of computer work.  Worsened in 3rd trimester.  10+ years of symptoms.  Thenar atrophy on the right.  Will evaluate with EMG.  F/u post EMG.

## 2014-12-17 ENCOUNTER — Telehealth: Payer: Self-pay | Admitting: Sports Medicine

## 2014-12-17 ENCOUNTER — Encounter: Payer: Self-pay | Admitting: Sports Medicine

## 2014-12-17 DIAGNOSIS — G5603 Carpal tunnel syndrome, bilateral upper limbs: Secondary | ICD-10-CM

## 2014-12-17 NOTE — Telephone Encounter (Signed)
I spoke with the patient in my office today about the EMG/nerve conduction studies on her right wrist ordered by Dr. Jimmye Norman. She has findings consistent with mild to moderate carpal tunnel syndrome. Symptoms have been present now for several years. She also has a history of 3 prior left wrist surgeries with some chronic pain there as well. I discussed treatment options with her and at this point we will try a simple cockup wrist brace while sleeping. I will also get her an appointment to see Dr Amedeo Plenty not only for her right wrist carpal tunnel syndrome but also for her chronic left wrist issues.  I reassured her that she does not have any evidence of cervical radiculopathy. She does have chronic neck and shoulder pain which is no doubt related to her occupation. She has had a couple of ergonomic evaluations but unfortunately the recommendations have not been followed. Patient at this point is considering just purchasing a [place] desk herself. She has several coworkers that have done this and had good results. I will remain open to seeing her as needed.

## 2014-12-17 NOTE — Telephone Encounter (Signed)
Pt is to call [facility] to schedule an appt with Dr. Amedeo Plenty once she has all her records from her surgeries of her left wrist from [facility]  All of her notes from [place] re: her Bilateral Carpal Tunnel were faxed to [phone]

## 2014-12-17 NOTE — Addendum Note (Signed)
Addended by: Cyd Silence on: 12/17/2014 03:36 PM   Modules accepted: Orders

## 2014-12-23 ENCOUNTER — Other Ambulatory Visit: Payer: Self-pay | Admitting: *Deleted

## 2014-12-23 MED ORDER — TRAMADOL HCL 50 MG PO TABS: ORAL_TABLET | ORAL | Status: AC

## 2015-03-18 ENCOUNTER — Encounter: Payer: Self-pay | Admitting: Family Medicine

## 2015-03-18 ENCOUNTER — Ambulatory Visit (INDEPENDENT_AMBULATORY_CARE_PROVIDER_SITE_OTHER): Payer: 59 | Admitting: Family Medicine

## 2015-03-18 VITALS — BP 123/85 | Ht 67.0 in | Wt 130.0 lb

## 2015-03-18 DIAGNOSIS — M545 Low back pain, unspecified: Secondary | ICD-10-CM

## 2015-03-18 MED ORDER — METHOCARBAMOL 750 MG PO TABS: ORAL_TABLET | ORAL | Status: AC

## 2015-03-18 MED FILL — METHOCARBAMOL 750 MG TABLET: 750 | 4 days supply | Qty: 30 | Fill #0

## 2015-03-18 MED FILL — LEVONOR-ETH ESTRAD 0.1-0.02: 0.1-20 | 28 days supply | Qty: 28 | Fill #1

## 2015-03-18 NOTE — Progress Notes (Signed)
  Latoya Weiss - 40 y.o. female MRN [id number]  Date of birth: [birth year]  CC: low back spasm  SUBJECTIVE:   HPI  Latoya Weiss is a very pleasant 40 year old female who presents with acute onset low back pain this morning. - She reports she has had intermittent back pain throughout her pregnancy and since her most recent delivery 7 months ago. - She has pain frequently while carrying her 47-month-old - No falls. - This a.m. while lifting her son she had acute onset back pain. - It's a Sharp 9 out of 10 aching pain near the midline of her lumbar spine. - She took an Ultram without any relief. - She denies any fevers chills or night sweats. - She has had some radiation to her left gluteus but nothing down her leg. - She denies any loss of bowel or bladder control. No lower extremity weakness  ROS:     10 piont RoS negative other than that listed in HPI above.   HISTORY: Past Medical, Surgical, Social, and Family History Reviewed & Updated per EMR.    OBJECTIVE: BP 123/85 mmHg  Ht 5\' 7"  (1.702 m)  Wt 130 lb (58.968 kg)  BMI 20.36 kg/m2  Physical Exam  Calm, NAD Non-labored breathing  Back Exam: Inspection: No visible abnormality Motion: Limited in extension due to back pain SLR seated:    Negative                      SLR lying: Negative Palpable tenderness: Tight and tender bilateral paraspinal muscles of T12-L3 FABER: Not checked Sensory change: Negative Reflex change: Negative  Strength at foot Plantar-flexion: 5 / 5    Dorsi-flexion: 5 / 5    Eversion:5  / 5   Inversion: 5 / 5 Leg strength Quad: 5 / 5   Hamstring: 5 / 5   Hip flexor:  5/ 5   Hip abductors:  5/ 5 Gait: non-antalgic        MEDICATIONS, LABS & OTHER ORDERS: Previous Medications   BUPROPION (WELLBUTRIN XL) 300 MG 24 HR TABLET    TAKE 1 TABLET BY MOUTH DAILY.   FLUVIRIN 0.5 ML SUSY    ADM 0.5ML IM UTD   LUTERA 0.1-20 MG-MCG TABLET    TK 1 T PO  DAILY   NAPROXEN (NAPROSYN) 500 MG TABLET    Take 1 tablet (500 mg  total) by mouth 2 (two) times daily with a meal.   PRENATAL VIT-FE FUMARATE-FA (PRENATAL MULTIVITAMIN) TABS TABLET    Take 1 tablet by mouth at bedtime.    TRAMADOL (ULTRAM) 50 MG TABLET    Take one a day with food for 7 days prn   Modified Medications   No medications on file   New Prescriptions   No medications on file   Discontinued Medications   No medications on file  No orders of the defined types were placed in this encounter.   ASSESSMENT & PLAN: See problem based charting & AVS for pt instructions.

## 2015-03-18 NOTE — Assessment & Plan Note (Signed)
Healthy 40 year old with ongoing back discomfort with acute onset thoracolumbar back pain with significant paraspinal muscle spasm. No red flags. No relief with Ultram. - Discussed return to clinic if any new symptoms develop or the pain worsens - Robaxin 750 1 to 2 every 6 hours. - Moist heat - Given low back handout and emphasized importance of initially just doing range of motion throughout the day with minimal rest if possible. Eventually she can work on trying to do the back exercises listed in the handout to help prevent further acute episodes. Discussed the importance of core strengthening, especially after pregnancy. - Follow up as needed

## 2015-03-19 DIAGNOSIS — M9903 Segmental and somatic dysfunction of lumbar region: Secondary | ICD-10-CM | POA: Diagnosis not present

## 2015-03-19 DIAGNOSIS — M545 Low back pain: Secondary | ICD-10-CM | POA: Diagnosis not present

## 2015-03-19 DIAGNOSIS — M62830 Muscle spasm of back: Secondary | ICD-10-CM | POA: Diagnosis not present

## 2015-03-19 DIAGNOSIS — M5413 Radiculopathy, cervicothoracic region: Secondary | ICD-10-CM | POA: Diagnosis not present

## 2015-03-19 DIAGNOSIS — M9901 Segmental and somatic dysfunction of cervical region: Secondary | ICD-10-CM | POA: Diagnosis not present

## 2015-03-19 DIAGNOSIS — M542 Cervicalgia: Secondary | ICD-10-CM | POA: Diagnosis not present

## 2015-03-26 DIAGNOSIS — H5211 Myopia, right eye: Secondary | ICD-10-CM | POA: Diagnosis not present

## 2015-03-26 DIAGNOSIS — H16223 Keratoconjunctivitis sicca, not specified as Sjogren's, bilateral: Secondary | ICD-10-CM | POA: Diagnosis not present

## 2015-03-26 DIAGNOSIS — H04123 Dry eye syndrome of bilateral lacrimal glands: Secondary | ICD-10-CM | POA: Diagnosis not present

## 2015-03-27 DIAGNOSIS — Z01419 Encounter for gynecological examination (general) (routine) without abnormal findings: Secondary | ICD-10-CM | POA: Diagnosis not present

## 2015-03-27 MED FILL — LOTEMAX 0.5% GEL: 0.5 | 25 days supply | Qty: 5 | Fill #0

## 2015-04-06 DIAGNOSIS — L918 Other hypertrophic disorders of the skin: Secondary | ICD-10-CM | POA: Diagnosis not present

## 2015-04-06 DIAGNOSIS — N898 Other specified noninflammatory disorders of vagina: Secondary | ICD-10-CM | POA: Diagnosis not present

## 2015-04-06 DIAGNOSIS — Z1231 Encounter for screening mammogram for malignant neoplasm of breast: Secondary | ICD-10-CM | POA: Diagnosis not present

## 2015-04-07 MED FILL — traMADol HCL 50 MG TABS: 50 | 7 days supply | Qty: 30 | Fill #0

## 2015-04-20 DIAGNOSIS — L909 Atrophic disorder of skin, unspecified: Secondary | ICD-10-CM | POA: Diagnosis not present

## 2015-05-14 MED FILL — LEVONOR-ETH ESTRAD 0.1-0.02: 0.1-20 | 84 days supply | Qty: 84 | Fill #0

## 2015-05-19 MED FILL — BUPROPION HCL XL 300 MG TAB: 300 | 90 days supply | Qty: 90 | Fill #3

## 2015-05-27 MED FILL — traMADol HCL 50 MG TABS: 50 | 40 days supply | Qty: 40 | Fill #1

## 2015-07-22 ENCOUNTER — Encounter (HOSPITAL_BASED_OUTPATIENT_CLINIC_OR_DEPARTMENT_OTHER): Payer: Self-pay

## 2015-07-22 ENCOUNTER — Emergency Department (HOSPITAL_BASED_OUTPATIENT_CLINIC_OR_DEPARTMENT_OTHER)
Admission: EM | Admit: 2015-07-22 | Discharge: 2015-07-22 | Disposition: A | Discharge status: Home / Self Care | Payer: 59 | Attending: Emergency Medicine | Admitting: Emergency Medicine

## 2015-07-22 DIAGNOSIS — R55 Syncope and collapse: Secondary | ICD-10-CM | POA: Insufficient documentation

## 2015-07-22 DIAGNOSIS — R1110 Vomiting, unspecified: Secondary | ICD-10-CM | POA: Insufficient documentation

## 2015-07-22 DIAGNOSIS — R197 Diarrhea, unspecified: Secondary | ICD-10-CM | POA: Diagnosis not present

## 2015-07-22 DIAGNOSIS — Z79899 Other long term (current) drug therapy: Secondary | ICD-10-CM | POA: Diagnosis not present

## 2015-07-22 DIAGNOSIS — F329 Major depressive disorder, single episode, unspecified: Secondary | ICD-10-CM | POA: Insufficient documentation

## 2015-07-22 LAB — CBC
HCT: 45 % (ref 36.0–46.0)
Hemoglobin: 15.4 g/dL — ABNORMAL HIGH (ref 12.0–15.0)
MCH: 30.6 pg (ref 26.0–34.0)
MCHC: 34.2 g/dL (ref 30.0–36.0)
MCV: 89.3 fL (ref 78.0–100.0)
PLATELETS: 237 10*3/uL (ref 150–400)
RBC: 5.04 MIL/uL (ref 3.87–5.11)
RDW: 13.3 % (ref 11.5–15.5)
WBC: 12.4 10*3/uL — ABNORMAL HIGH (ref 4.0–10.5)

## 2015-07-22 LAB — URINE MICROSCOPIC-ADD ON

## 2015-07-22 LAB — URINALYSIS, ROUTINE W REFLEX MICROSCOPIC
BILIRUBIN URINE: NEGATIVE
GLUCOSE, UA: NEGATIVE mg/dL
Hgb urine dipstick: NEGATIVE
Ketones, ur: 40 mg/dL — AB
Nitrite: NEGATIVE
PH: 6.5 (ref 5.0–8.0)
Protein, ur: NEGATIVE mg/dL
Specific Gravity, Urine: 1.035 — ABNORMAL HIGH (ref 1.005–1.030)

## 2015-07-22 LAB — BASIC METABOLIC PANEL
Anion gap: 9 (ref 5–15)
BUN: 14 mg/dL (ref 6–20)
CO2: 25 mmol/L (ref 22–32)
Calcium: 8.8 mg/dL — ABNORMAL LOW (ref 8.9–10.3)
Chloride: 102 mmol/L (ref 101–111)
Creatinine, Ser: 0.85 mg/dL (ref 0.44–1.00)
GFR calc Af Amer: >60 mL/min (ref >60)
Glucose, Bld: 109 mg/dL — ABNORMAL HIGH (ref 65–99)
POTASSIUM: 3.6 mmol/L (ref 3.5–5.1)
Sodium: 136 mmol/L (ref 135–145)

## 2015-07-22 LAB — PREGNANCY, URINE: Preg Test, Ur: NEGATIVE

## 2015-07-22 MED ORDER — SODIUM CHLORIDE 0.9 % IV BOLUS (SEPSIS)
1000.0000 mL | Freq: Once | INTRAVENOUS | Status: AC
  Administered 2015-07-22: 1000 mL via INTRAVENOUS

## 2015-07-22 MED ORDER — KETOROLAC TROMETHAMINE 30 MG/ML IJ SOLN
30.0000 mg | Freq: Once | INTRAMUSCULAR | Status: AC
  Administered 2015-07-22: 30 mg via INTRAVENOUS
  Filled 2015-07-22: qty 1

## 2015-07-22 MED ORDER — ONDANSETRON HCL 4 MG/2ML IJ SOLN
4.0000 mg | Freq: Once | INTRAMUSCULAR | Status: AC
  Administered 2015-07-22: 4 mg via INTRAVENOUS
  Filled 2015-07-22: qty 2

## 2015-07-22 MED ORDER — ONDANSETRON HCL 4 MG PO TABS: 4.0000 mg | ORAL_TABLET | Freq: Four times a day (QID) | ORAL | Status: AC

## 2015-07-22 MED FILL — ONDANSETRON HCL 4 MG TABLET: 4 | 3 days supply | Qty: 12 | Fill #0

## 2015-07-22 NOTE — ED Notes (Signed)
C/o body aches, n/v/d x this am-steady gait

## 2015-07-22 NOTE — ED Provider Notes (Signed)
CSN: [id number]     Arrival date & time 07/22/15  1454 History   First MD Initiated Contact with Patient 07/22/15 1535     Chief Complaint  Patient presents with  . Generalized Body Aches     (Consider location/radiation/quality/duration/timing/severity/associated sxs/prior Treatment) HPI  Pt presenting with c/o vomiting and diarrhea.  She states this morning her symptoms began acutely- she had numerous episodes of watery diarrhea- no blood.  Had 2 episodes of emesis- nonbloody and nonbilious.  No abdominal pain but does have some cramping.  No fever, but has felt hot and cold.  No dysuria.  Pt states her son has had similar illness.  She had an episode of syncope after last diarrhea.  She welt woozy and lightheaded and then fainted- no chest pain or palpitations, no severe headache.  No treatment prior to arrival.   There are no other associated systemic symptoms, there are no other alleviating or modifying factors.   Past Medical History  Diagnosis Date  . Anxiety   . Depression   . Herpes   . 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 Post-dates pregnancy 05/02/2013   Past Surgical History  Procedure Laterality Date  . Wrist surgery    . Ankle surgery    . Wisdom tooth extraction    . Fracture surgery     Family History  Problem Relation Age of Onset  . Anxiety disorder Mother   . Anxiety disorder Maternal Grandmother   . Diabetes Maternal Grandmother    Social History  Substance Use Topics  . Smoking status: Never Smoker   . Smokeless tobacco: Never Used  . Alcohol Use: No   OB History    Gravida Para Term Preterm AB TAB SAB Ectopic Multiple Living   2 2 2       2      Review of Systems  ROS reviewed and all otherwise negative except for mentioned in HPI    Allergies  Review of patient's allergies indicates no known allergies.  Home Medications   Prior to Admission medications   Medication Sig Start Date End Date Taking? Authorizing Provider  BuPROPion HCl  (WELLBUTRIN PO) Take by mouth.   Yes Historical Provider, MD  methocarbamol (ROBAXIN-750) 750 MG tablet Take 1-2 tabs q6h prn 03/18/15   Gerre Pebbles, MD  ondansetron (ZOFRAN) 4 MG tablet Take 1 tablet (4 mg total) by mouth every 6 (six) hours. 07/22/15   Alfonzo Beers, MD  traMADol Veatrice Bourbon) 50 MG tablet Take one a day with food for 7 days prn 12/23/14   Timothy R Draper, DO   BP 100/66 mmHg  Pulse 99  Temp(Src) 98 F (36.7 C) (Oral)  Resp 20  Ht 5\' 7"  (1.702 m)  Wt 61.236 kg  BMI 21.14 kg/m2  SpO2 99%  LMP 07/15/2015  Vitals reviewed Physical Exam  Physical Examination: General appearance - alert, well appearing, and in no distress Mental status - alert, oriented to person, place, and time Eyes - no conjunctival injection, no scleral icterus Mouth - mucous membranes moist, pharynx normal without lesions Neck - supple, no significant adenopathy Chest - clear to auscultation, no wheezes, rales or rhonchi, symmetric air entry Heart - normal rate, regular rhythm, normal S1, S2, no murmurs, rubs, clicks or gallops Abdomen - soft, nontender, nondistended, no masses or organomegaly Neurological - alert, oriented, normal speech Extremities - peripheral pulses normal, no pedal edema, no clubbing or cyanosis Skin - normal coloration and turgor, no rashes  ED Course  Procedures (  including critical care time) Labs Review Labs Reviewed  URINALYSIS, ROUTINE W REFLEX MICROSCOPIC (NOT AT [facility]) - Abnormal; Notable for the following:    Color, Urine AMBER (*)    APPearance CLOUDY (*)    Specific Gravity, Urine 1.035 (*)    Ketones, ur 40 (*)    Leukocytes, UA SMALL (*)    All other components within normal limits  CBC - Abnormal; Notable for the following:    WBC 12.4 (*)    Hemoglobin 15.4 (*)    All other components within normal limits  BASIC METABOLIC PANEL - Abnormal; Notable for the following:    Glucose, Bld 109 (*)    Calcium 8.8 (*)    All other components within normal limits   URINE MICROSCOPIC-ADD ON - Abnormal; Notable for the following:    Squamous Epithelial / LPF 0-5 (*)    Bacteria, UA MANY (*)    All other components within normal limits  PREGNANCY, URINE    Imaging Review No results found. I have personally reviewed and evaluated these images and lab results as part of my medical decision-making.   EKG Interpretation None      MDM   Final diagnoses:  Vomiting and diarrhea  Syncope, unspecified syncope type    Pt presenting with c/o vomiting and diarrhea and brief syncopal episode.  Pt treated with IV fluids, antiemetics.  Labs are reassuring.  She feels much improved on recheck and has been able to tolerate po fluids.  Suspect viral infection as son has had similar symptoms at home. Suspect vasovagal syncope.  Pt declined to have EKG peformed, no arrythmia seen on the monitor.    Note- pt declined EKG  Alfonzo Beers, MD 07/23/15 [phone]

## 2015-07-22 NOTE — Discharge Instructions (Signed)
Return to the ED with any concerns including vomiting and not able to keep down liquids or your medications, abdominal pain especially if it localizes to the right lower abdomen, fever or chills, and decreased urine output, decreased level of alertness or lethargy, or any other alarming symptoms.  °

## 2015-07-22 NOTE — ED Notes (Signed)
Sprite given for fluid challenge.

## 2015-07-22 NOTE — ED Notes (Signed)
MD at bedside. 

## 2015-07-22 NOTE — ED Notes (Signed)
MD at bedside discussing test results and dispo plan of care. 

## 2015-08-13 MED FILL — BUPROPION HCL XL 300 MG TAB: 300 | 90 days supply | Qty: 90 | Fill #4

## 2015-08-13 MED FILL — LEVONOR-ETH ESTRAD 0.1-0.02: 0.1-20 | 84 days supply | Qty: 84 | Fill #1

## 2015-11-09 DIAGNOSIS — F331 Major depressive disorder, recurrent, moderate: Secondary | ICD-10-CM | POA: Diagnosis not present

## 2015-11-09 DIAGNOSIS — F411 Generalized anxiety disorder: Secondary | ICD-10-CM | POA: Diagnosis not present

## 2015-11-09 DIAGNOSIS — E559 Vitamin D deficiency, unspecified: Secondary | ICD-10-CM | POA: Diagnosis not present

## 2015-11-27 MED FILL — BUPROPION HCL XL 300 MG TAB: 300 | 90 days supply | Qty: 90 | Fill #0

## 2016-01-01 DIAGNOSIS — E559 Vitamin D deficiency, unspecified: Secondary | ICD-10-CM | POA: Diagnosis not present

## 2016-01-01 DIAGNOSIS — Z0000 Encounter for general adult medical examination without abnormal findings: Secondary | ICD-10-CM | POA: Diagnosis not present

## 2016-01-05 ENCOUNTER — Other Ambulatory Visit: Payer: Self-pay | Admitting: Family Medicine

## 2016-01-05 DIAGNOSIS — Z0000 Encounter for general adult medical examination without abnormal findings: Secondary | ICD-10-CM | POA: Diagnosis not present

## 2016-01-05 DIAGNOSIS — N644 Mastodynia: Secondary | ICD-10-CM

## 2016-01-19 ENCOUNTER — Ambulatory Visit
Admission: RE | Admit: 2016-01-19 | Discharge: 2016-01-19 | Disposition: A | Discharge status: Home / Self Care | Payer: 59 | Source: Ambulatory Visit | Attending: Family Medicine | Admitting: Family Medicine

## 2016-01-19 DIAGNOSIS — N6310 Unspecified lump in the right breast, unspecified quadrant: Secondary | ICD-10-CM | POA: Diagnosis not present

## 2016-01-19 DIAGNOSIS — N644 Mastodynia: Secondary | ICD-10-CM

## 2016-03-04 MED FILL — BUPROPION HCL XL 300 MG TAB: 300 | 90 days supply | Qty: 90 | Fill #1

## 2016-04-25 DIAGNOSIS — Z1151 Encounter for screening for human papillomavirus (HPV): Secondary | ICD-10-CM | POA: Diagnosis not present

## 2016-04-25 DIAGNOSIS — Z6821 Body mass index (BMI) 21.0-21.9, adult: Secondary | ICD-10-CM | POA: Diagnosis not present

## 2016-04-25 DIAGNOSIS — Z01419 Encounter for gynecological examination (general) (routine) without abnormal findings: Secondary | ICD-10-CM | POA: Diagnosis not present

## 2016-06-01 MED FILL — raNITIdine HCL 150 MG TABS: 150 | 90 days supply | Qty: 90 | Fill #0 | Status: TO

## 2016-06-01 MED FILL — BUPROPION HCL XL 300 MG TAB: 300 | 90 days supply | Qty: 90 | Fill #0 | Status: TO

## 2016-06-14 DIAGNOSIS — Z6822 Body mass index (BMI) 22.0-22.9, adult: Secondary | ICD-10-CM | POA: Diagnosis not present

## 2016-06-14 DIAGNOSIS — B351 Tinea unguium: Secondary | ICD-10-CM | POA: Diagnosis not present

## 2016-06-14 DIAGNOSIS — M722 Plantar fascial fibromatosis: Secondary | ICD-10-CM | POA: Diagnosis not present

## 2016-06-14 DIAGNOSIS — M79671 Pain in right foot: Secondary | ICD-10-CM | POA: Diagnosis not present

## 2016-06-14 DIAGNOSIS — M79672 Pain in left foot: Secondary | ICD-10-CM | POA: Diagnosis not present

## 2016-06-16 MED FILL — DICLOFENAC SODIUM 1% GEL: 1 | 19 days supply | Qty: 300 | Fill #0

## 2016-08-30 MED FILL — raNITIdine HCL 150 MG TABS: 150 | 90 days supply | Qty: 90 | Fill #0

## 2016-08-30 MED FILL — BUPROPION HCL XL 300 MG TAB: 300 | 90 days supply | Qty: 90 | Fill #0

## 2016-12-06 MED FILL — BUPROPION HCL XL 300 MG TAB: 300 | 90 days supply | Qty: 90 | Fill #1

## 2017-02-01 MED FILL — raNITIdine HCL 150 MG TABS: 150 | 90 days supply | Qty: 90 | Fill #1

## 2017-02-13 DIAGNOSIS — H5213 Myopia, bilateral: Secondary | ICD-10-CM | POA: Diagnosis not present

## 2017-02-17 MED FILL — XIIDRA 5% EYE DROPS: 5 | 30 days supply | Qty: 60 | Fill #0

## 2017-03-02 MED FILL — BUPROPION HCL XL 300 MG TAB: 300 | 90 days supply | Qty: 90 | Fill #2

## 2017-03-25 ENCOUNTER — Emergency Department (HOSPITAL_BASED_OUTPATIENT_CLINIC_OR_DEPARTMENT_OTHER)
Admission: EM | Admit: 2017-03-25 | Discharge: 2017-03-25 | Disposition: A | Discharge status: Home / Self Care | Payer: 59 | Attending: Emergency Medicine | Admitting: Emergency Medicine

## 2017-03-25 ENCOUNTER — Encounter (HOSPITAL_BASED_OUTPATIENT_CLINIC_OR_DEPARTMENT_OTHER): Payer: Self-pay | Admitting: Emergency Medicine

## 2017-03-25 ENCOUNTER — Emergency Department (HOSPITAL_BASED_OUTPATIENT_CLINIC_OR_DEPARTMENT_OTHER): Payer: 59

## 2017-03-25 ENCOUNTER — Other Ambulatory Visit: Payer: Self-pay

## 2017-03-25 DIAGNOSIS — M545 Low back pain: Secondary | ICD-10-CM | POA: Diagnosis not present

## 2017-03-25 DIAGNOSIS — Z79899 Other long term (current) drug therapy: Secondary | ICD-10-CM | POA: Diagnosis not present

## 2017-03-25 DIAGNOSIS — Y9389 Activity, other specified: Secondary | ICD-10-CM | POA: Diagnosis not present

## 2017-03-25 DIAGNOSIS — W19XXXA Unspecified fall, initial encounter: Secondary | ICD-10-CM

## 2017-03-25 DIAGNOSIS — W108XXA Fall (on) (from) other stairs and steps, initial encounter: Secondary | ICD-10-CM | POA: Diagnosis not present

## 2017-03-25 DIAGNOSIS — S3992XA Unspecified injury of lower back, initial encounter: Secondary | ICD-10-CM | POA: Diagnosis not present

## 2017-03-25 DIAGNOSIS — Y939 Activity, unspecified: Secondary | ICD-10-CM | POA: Diagnosis not present

## 2017-03-25 LAB — HCG, SERUM, QUALITATIVE: Preg, Serum: POSITIVE — AB

## 2017-03-25 LAB — CBG MONITORING, ED: GLUCOSE-CAPILLARY: 108 mg/dL — AB (ref 65–99)

## 2017-03-25 LAB — HCG, QUANTITATIVE, PREGNANCY

## 2017-03-25 MED ORDER — MORPHINE SULFATE (PF) 4 MG/ML IV SOLN
6.0000 mg | Freq: Once | INTRAVENOUS | Status: AC
  Administered 2017-03-25: 6 mg via INTRAVENOUS
  Filled 2017-03-25: qty 2

## 2017-03-25 MED ORDER — MORPHINE SULFATE (PF) 4 MG/ML IV SOLN
4.0000 mg | Freq: Once | INTRAVENOUS | Status: AC
  Administered 2017-03-25: 4 mg via INTRAVENOUS
  Filled 2017-03-25: qty 1

## 2017-03-25 MED ORDER — HYDROCODONE-ACETAMINOPHEN 5-325 MG PO TABS: 1.0000 | ORAL_TABLET | Freq: Four times a day (QID) | ORAL | 0 refills | Status: AC | PRN

## 2017-03-25 NOTE — ED Notes (Signed)
Pt refused wheelchair in the lobby.  Requested to use her crutches.

## 2017-03-25 NOTE — ED Triage Notes (Addendum)
Pt reports she tripped and fell down 6 stairs. Denies LOC. Pt c/o severe lower back pain. Pt took 2 hydrocodone and a muscle relaxer without relief.

## 2017-03-25 NOTE — Discharge Instructions (Signed)
Take Advil for mild pain or the pain medicine prescribed for bad pain.  You can also take the methocarbamol you have at home as needed for spasm.  See your primary care physician if having significant pain in 4 or 5 days.  Return if concern for any reason

## 2017-03-25 NOTE — ED Notes (Signed)
Pt took hydrocodone (at 1100) and methocarbamol (at 930) pta. She had leftover meds from surgery 4 years ago. States pain is not improving. Reports severe pain right hip and into right leg with standing. Also states difficulty bearing weight on right leg. Using crutches from home that are from a prior injury. No tenderness noted to palpation of spine. Pt sitting on side of stretcher at this time for comfort. Family at bedside.

## 2017-03-25 NOTE — ED Notes (Signed)
Patient transported to X-ray and returned 

## 2017-03-25 NOTE — ED Notes (Addendum)
Dr. Lenna Sciara made aware of serum pregnancy results. Xray not done

## 2017-03-25 NOTE — ED Provider Notes (Addendum)
[facility] DEPARTMENT Provider Note   CSN: [id number] Arrival date & time: 03/25/17  1243     History   Chief Complaint Chief Complaint  Patient presents with  . Fall    HPI Latoya Weiss is a 42 y.o. female.  HPI Tripped and fell down 6 steps 9:30 AM today she complains of right-sided  para lumbar back pain since the event.  Pain radiates to her right buttock pain is exacerbated by weightbearing.  Improved with remaining still.  No other injury.  Treat herself with hydrocodone and methocarbamol with minimal relief.  No other associated symptoms. Past Medical History:  Diagnosis Date  . Anxiety   . Depression   . Herpes   . Post-dates pregnancy 05/02/2013  . Uterine size-date discrepancy 05/02/2013    Patient Active Problem List   Diagnosis Date Noted  . Lumbar back pain 03/18/2015  . Numbness or tingling 12/03/2014  . Neck pain 12/03/2014  . Postpartum care following vaginal delivery (6/17) 08/15/2014    Past Surgical History:  Procedure Laterality Date  . ANKLE SURGERY    . FRACTURE SURGERY    . WISDOM TOOTH EXTRACTION    . WRIST SURGERY      OB History    Gravida Para Term Preterm AB Living   2 2 2     2    SAB TAB Ectopic Multiple Live Births           2       Home Medications    Prior to Admission medications   Medication Sig Start Date End Date Taking? Authorizing Provider  BuPROPion HCl (WELLBUTRIN PO) Take by mouth.    [provider]  methocarbamol (ROBAXIN-750) 750 MG tablet Take 1-2 tabs q6h prn 03/18/15   Gerre Pebbles, MD  ondansetron (ZOFRAN) 4 MG tablet Take 1 tablet (4 mg total) by mouth every 6 (six) hours. 07/22/15   Pixie Casino, MD  traMADol Veatrice Bourbon) 50 MG tablet Take one a day with food for 7 days prn 12/23/14   Thurman Coyer, DO    Family History Family History  Problem Relation Age of Onset  . Anxiety disorder Mother   . Anxiety disorder Maternal Grandmother   . Diabetes Maternal Grandmother       Social History Social History   Tobacco Use  . Smoking status: Never Smoker  . Smokeless tobacco: Never Used  Substance Use Topics  . Alcohol use: No  . Drug use: No     Allergies   Patient has no known allergies.   Review of Systems Review of Systems  Constitutional: Negative.   HENT: Negative.   Respiratory: Negative.   Cardiovascular: Negative.   Gastrointestinal: Negative.   Genitourinary:       Currently on menses  Musculoskeletal: Positive for back pain.  Skin: Negative.   Neurological: Negative.   Psychiatric/Behavioral: Negative.   All other systems reviewed and are negative.    Physical Exam Updated Vital Signs BP 103/64 (BP Location: Right Arm)   Pulse 88   Temp 98.3 F (36.8 C) (Oral)   Resp 18   Ht 5\' 6"  (1.676 m)   Wt 59 kg (130 lb)   LMP 03/22/2017   SpO2 100%   BMI 20.98 kg/m   Physical Exam  Constitutional: She is oriented to person, place, and time. She appears well-developed and well-nourished. She appears distressed.  Appears uncomfortable  HENT:  Head: Normocephalic and atraumatic.  Eyes: Conjunctivae are normal. Pupils  are equal, round, and reactive to light.  Neck: Neck supple. No tracheal deviation present. No thyromegaly present.  Cardiovascular: Normal rate and regular rhythm.  No murmur heard. Pulmonary/Chest: Effort normal and breath sounds normal.  Abdominal: Soft. Bowel sounds are normal. She exhibits no distension. There is no tenderness.  Musculoskeletal: Normal range of motion. She exhibits no edema or tenderness.  Entire spine is nontender.  She has right-sided paralumbar tenderness.  All 4 extremities without contusion abrasion or tenderness neurovascularly intact  Neurological: She is alert and oriented to person, place, and time. Coordination normal.  DTR symmetric bilaterally at knee jerk ankle jerk and biceps toes downward going bilaterally  Skin: Skin is warm and dry. No rash noted.  Psychiatric: She has a  normal mood and affect.  Nursing note and vitals reviewed.    ED Treatments / Results  Labs (all labs ordered are listed, but only abnormal results are displayed) Labs Reviewed  CBG MONITORING, ED - Abnormal; Notable for the following components:      Result Value   Glucose-Capillary 108 (*)    All other components within normal limits    EKG  EKG Interpretation None     X-rays viewed by me Results for orders placed or performed during the hospital encounter of 03/25/17  hCG, serum, qualitative  Result Value Ref Range   Preg, Serum POSITIVE (A) NEGATIVE  hCG, quantitative, pregnancy  Result Value Ref Range   hCG, Beta Chain, Quant, S <1 <5 mIU/mL  CBG monitoring, ED  Result Value Ref Range   Glucose-Capillary 108 (H) 65 - 99 mg/dL   Dg Lumbar Spine Complete  Result Date: 03/25/2017 CLINICAL DATA:  Pulled down 6 stairs. EXAM: LUMBAR SPINE - COMPLETE 4+ VIEW COMPARISON:  None. FINDINGS: Convex rightward rotatory scoliosis evident. No fracture. No subluxation. Intervertebral disc space is preserved. SI joints unremarkable. IMPRESSION: Negative. Electronically Signed   By: Misty Stanley M.D.   On: 03/25/2017 18:14   Radiology No results found.  Procedures Procedures (including critical care time)  Medications Ordered in ED Medications - No data to display   Initial Impression / Assessment and Plan / ED Course  I have reviewed the triage vital signs and the nursing notes.  Pertinent labs & imaging results that were available during my care of the patient were reviewed by me and considered in my medical decision making (see chart for details).     4:30 PM pain improved after treatment with intravenous morphine. Initial positive pregnancy test felt to be factitious  7:05 PM pain improved after treatment with intravenous opioids feels ready to go home plan prescription Norco, Advil for pain.  Follow-up with PMD if significant pain in 4 or 5 days.[place]  Controlled Substance reporting System queried Results for orders placed or performed during the hospital encounter of 03/25/17  hCG, serum, qualitative  Result Value Ref Range   Preg, Serum POSITIVE (A) NEGATIVE  hCG, quantitative, pregnancy  Result Value Ref Range   hCG, Beta Chain, Quant, S <1 <5 mIU/mL  CBG monitoring, ED  Result Value Ref Range   Glucose-Capillary 108 (H) 65 - 99 mg/dL   Dg Lumbar Spine Complete  Result Date: 03/25/2017 CLINICAL DATA:  Pulled down 6 stairs. EXAM: LUMBAR SPINE - COMPLETE 4+ VIEW COMPARISON:  None. FINDINGS: Convex rightward rotatory scoliosis evident. No fracture. No subluxation. Intervertebral disc space is preserved. SI joints unremarkable. IMPRESSION: Negative. Electronically Signed   By: Misty Stanley M.D.   On: 03/25/2017 18:14  Final Clinical Impressions(s) / ED Diagnoses  Dx #1 fall #2 low back pain Final diagnoses:  None    ED Discharge Orders    None       Orlie Dakin, MD 03/25/17 1610    Orlie Dakin, MD 03/25/17 [id number]    Orlie Dakin, MD 03/25/17 [phone]

## 2017-03-29 DIAGNOSIS — M5431 Sciatica, right side: Secondary | ICD-10-CM | POA: Diagnosis not present

## 2017-03-29 DIAGNOSIS — K5900 Constipation, unspecified: Secondary | ICD-10-CM | POA: Diagnosis not present

## 2017-03-29 DIAGNOSIS — M549 Dorsalgia, unspecified: Secondary | ICD-10-CM | POA: Diagnosis not present

## 2017-03-29 DIAGNOSIS — Z6823 Body mass index (BMI) 23.0-23.9, adult: Secondary | ICD-10-CM | POA: Diagnosis not present

## 2017-04-11 DIAGNOSIS — Z6821 Body mass index (BMI) 21.0-21.9, adult: Secondary | ICD-10-CM | POA: Diagnosis not present

## 2017-04-11 DIAGNOSIS — Z01419 Encounter for gynecological examination (general) (routine) without abnormal findings: Secondary | ICD-10-CM | POA: Diagnosis not present

## 2017-04-11 DIAGNOSIS — Z1231 Encounter for screening mammogram for malignant neoplasm of breast: Secondary | ICD-10-CM | POA: Diagnosis not present

## 2017-04-28 DIAGNOSIS — M5431 Sciatica, right side: Secondary | ICD-10-CM | POA: Diagnosis not present

## 2017-04-28 DIAGNOSIS — Z6822 Body mass index (BMI) 22.0-22.9, adult: Secondary | ICD-10-CM | POA: Diagnosis not present

## 2017-05-12 DIAGNOSIS — Z0000 Encounter for general adult medical examination without abnormal findings: Secondary | ICD-10-CM | POA: Diagnosis not present

## 2017-05-12 DIAGNOSIS — E559 Vitamin D deficiency, unspecified: Secondary | ICD-10-CM | POA: Diagnosis not present

## 2017-05-15 DIAGNOSIS — Z0000 Encounter for general adult medical examination without abnormal findings: Secondary | ICD-10-CM | POA: Diagnosis not present

## 2017-05-15 DIAGNOSIS — Z6822 Body mass index (BMI) 22.0-22.9, adult: Secondary | ICD-10-CM | POA: Diagnosis not present

## 2017-06-06 MED FILL — BuPROPion HCL ER (XL) 300 M: 300 | 90 days supply | Qty: 90 | Fill #0

## 2017-09-05 MED FILL — buPROPion HCL ER (XL) 300 M: 300 | 90 days supply | Qty: 90 | Fill #1

## 2017-09-05 MED FILL — raNITIdine HCL 150 MG TABS: 150 | 90 days supply | Qty: 90 | Fill #0

## 2017-10-17 DIAGNOSIS — J069 Acute upper respiratory infection, unspecified: Secondary | ICD-10-CM | POA: Diagnosis not present

## 2017-10-17 DIAGNOSIS — Z6822 Body mass index (BMI) 22.0-22.9, adult: Secondary | ICD-10-CM | POA: Diagnosis not present

## 2017-10-17 DIAGNOSIS — J029 Acute pharyngitis, unspecified: Secondary | ICD-10-CM | POA: Diagnosis not present

## 2017-10-17 MED FILL — IPRATROPIUM 0.06% SPRAY: 0.06 | 14 days supply | Qty: 15 | Fill #0

## 2017-10-17 MED FILL — BENZONATATE 200 MG CAPS: 200 | 10 days supply | Qty: 30 | Fill #0

## 2017-10-17 MED FILL — FLUTICASONE PROP 50 MCG SPR: 50 | 30 days supply | Qty: 16 | Fill #0

## 2017-10-17 MED FILL — MONTELUKAST SOD 10 MG TAB: 10 | 30 days supply | Qty: 30 | Fill #0

## 2017-11-08 DIAGNOSIS — L608 Other nail disorders: Secondary | ICD-10-CM | POA: Diagnosis not present

## 2017-11-08 DIAGNOSIS — D3611 Benign neoplasm of peripheral nerves and autonomic nervous system of face, head, and neck: Secondary | ICD-10-CM | POA: Diagnosis not present

## 2017-11-08 DIAGNOSIS — L821 Other seborrheic keratosis: Secondary | ICD-10-CM | POA: Diagnosis not present

## 2017-11-28 MED FILL — raNITIdine HCL 150 MG TABS: 150 | 90 days supply | Qty: 90 | Fill #1

## 2017-11-28 MED FILL — buPROPion HCL ER (XL) 300 M: 300 | 90 days supply | Qty: 90 | Fill #2

## 2018-03-02 MED FILL — buPROPion HCL ER (XL) 300 M: 300 | 90 days supply | Qty: 90 | Fill #3

## 2018-03-05 DIAGNOSIS — Z6823 Body mass index (BMI) 23.0-23.9, adult: Secondary | ICD-10-CM | POA: Diagnosis not present

## 2018-03-05 DIAGNOSIS — M549 Dorsalgia, unspecified: Secondary | ICD-10-CM | POA: Diagnosis not present

## 2018-03-05 DIAGNOSIS — K219 Gastro-esophageal reflux disease without esophagitis: Secondary | ICD-10-CM | POA: Diagnosis not present

## 2018-03-05 DIAGNOSIS — Z23 Encounter for immunization: Secondary | ICD-10-CM | POA: Diagnosis not present

## 2018-03-05 DIAGNOSIS — F331 Major depressive disorder, recurrent, moderate: Secondary | ICD-10-CM | POA: Diagnosis not present

## 2018-04-24 DIAGNOSIS — H5213 Myopia, bilateral: Secondary | ICD-10-CM | POA: Diagnosis not present

## 2018-04-24 MED FILL — TOBRAMYCIN-DEXAMETH OPTH SU: 0.3-0.1 | 25 days supply | Qty: 5 | Fill #0

## 2018-05-16 MED FILL — buPROPion HCL ER (XL) 300 M: 300 | 90 days supply | Qty: 90 | Fill #0

## 2018-08-07 DIAGNOSIS — R202 Paresthesia of skin: Secondary | ICD-10-CM | POA: Diagnosis not present

## 2018-08-20 DIAGNOSIS — M79601 Pain in right arm: Secondary | ICD-10-CM | POA: Diagnosis not present

## 2018-08-20 DIAGNOSIS — R200 Anesthesia of skin: Secondary | ICD-10-CM | POA: Diagnosis not present

## 2018-08-20 DIAGNOSIS — R202 Paresthesia of skin: Secondary | ICD-10-CM | POA: Diagnosis not present

## 2018-08-20 DIAGNOSIS — M79602 Pain in left arm: Secondary | ICD-10-CM | POA: Diagnosis not present

## 2018-08-21 DIAGNOSIS — M5023 Other cervical disc displacement, cervicothoracic region: Secondary | ICD-10-CM | POA: Diagnosis not present

## 2018-08-21 DIAGNOSIS — R202 Paresthesia of skin: Secondary | ICD-10-CM | POA: Diagnosis not present

## 2018-08-21 DIAGNOSIS — M47812 Spondylosis without myelopathy or radiculopathy, cervical region: Secondary | ICD-10-CM | POA: Diagnosis not present

## 2018-08-23 DIAGNOSIS — R200 Anesthesia of skin: Secondary | ICD-10-CM | POA: Diagnosis not present

## 2018-09-11 MED FILL — buPROPion HCL ER (XL) 300 M: 300 | 90 days supply | Qty: 90 | Fill #1

## 2018-10-29 DIAGNOSIS — E559 Vitamin D deficiency, unspecified: Secondary | ICD-10-CM | POA: Diagnosis not present

## 2018-10-29 DIAGNOSIS — N921 Excessive and frequent menstruation with irregular cycle: Secondary | ICD-10-CM | POA: Diagnosis not present

## 2018-10-29 DIAGNOSIS — Z01419 Encounter for gynecological examination (general) (routine) without abnormal findings: Secondary | ICD-10-CM | POA: Diagnosis not present

## 2018-10-29 DIAGNOSIS — Z6821 Body mass index (BMI) 21.0-21.9, adult: Secondary | ICD-10-CM | POA: Diagnosis not present

## 2018-10-29 DIAGNOSIS — Z1231 Encounter for screening mammogram for malignant neoplasm of breast: Secondary | ICD-10-CM | POA: Diagnosis not present

## 2018-10-29 MED FILL — NORETHINDRONE 5 MG TABLET: 5 | 7 days supply | Qty: 7 | Fill #0

## 2018-11-07 ENCOUNTER — Other Ambulatory Visit: Payer: Self-pay

## 2018-11-07 ENCOUNTER — Ambulatory Visit
Admission: RE | Admit: 2018-11-07 | Discharge: 2018-11-07 | Disposition: A | Discharge status: Home / Self Care | Payer: 59 | Source: Ambulatory Visit | Attending: Chiropractic Medicine | Admitting: Chiropractic Medicine

## 2018-11-07 ENCOUNTER — Other Ambulatory Visit: Payer: Self-pay | Admitting: Chiropractic Medicine

## 2018-11-07 DIAGNOSIS — M545 Low back pain, unspecified: Secondary | ICD-10-CM

## 2018-11-07 DIAGNOSIS — G5603 Carpal tunnel syndrome, bilateral upper limbs: Secondary | ICD-10-CM

## 2018-11-07 DIAGNOSIS — M47812 Spondylosis without myelopathy or radiculopathy, cervical region: Secondary | ICD-10-CM | POA: Diagnosis not present

## 2018-11-07 DIAGNOSIS — M79601 Pain in right arm: Secondary | ICD-10-CM

## 2018-11-07 DIAGNOSIS — M4184 Other forms of scoliosis, thoracic region: Secondary | ICD-10-CM | POA: Diagnosis not present

## 2018-11-07 DIAGNOSIS — R102 Pelvic and perineal pain: Secondary | ICD-10-CM | POA: Diagnosis not present

## 2018-11-07 DIAGNOSIS — M4186 Other forms of scoliosis, lumbar region: Secondary | ICD-10-CM | POA: Diagnosis not present

## 2018-12-13 MED FILL — buPROPion HCL ER (XL) 300 M: 300 | 90 days supply | Qty: 90 | Fill #2

## 2018-12-19 MED FILL — buPROPion HCL ER (XL) 300 M: 300 | 90 days supply | Qty: 90 | Fill #2

## 2019-03-19 MED FILL — BUPROPION HCL XL 300 MG TAB: 300 | 90 days supply | Qty: 90 | Fill #3

## 2019-03-20 ENCOUNTER — Ambulatory Visit: Payer: 59 | Attending: Internal Medicine

## 2019-03-20 DIAGNOSIS — Z23 Encounter for immunization: Secondary | ICD-10-CM | POA: Insufficient documentation

## 2019-03-20 NOTE — Progress Notes (Signed)
   Covid-19 Vaccination Clinic  Name:  Latoya Weiss    MRN: [id number] DOB: 12-14-1975  03/20/2019  [name] was observed post Covid-19 immunization for 15 minutes without incidence. She was provided with Vaccine Information Sheet and instruction to access the V-Safe system.   [name] was instructed to call 911 with any severe reactions post vaccine: Marland Kitchen Difficulty breathing  . Swelling of your face and throat  . A fast heartbeat  . A bad rash all over your body  . Dizziness and weakness    Immunizations Administered    Name Date Dose VIS Date Route   Pfizer COVID-19 Vaccine 03/20/2019  7:25 PM 0.3 mL 02/08/2019 Intramuscular   Manufacturer: [place]   Lot: [id number]   [place]: [id number]

## 2019-03-26 ENCOUNTER — Ambulatory Visit: Payer: 59

## 2019-04-10 ENCOUNTER — Ambulatory Visit: Payer: 59 | Attending: Internal Medicine

## 2019-04-10 DIAGNOSIS — Z23 Encounter for immunization: Secondary | ICD-10-CM | POA: Insufficient documentation

## 2019-04-10 NOTE — Progress Notes (Signed)
   Covid-19 Vaccination Clinic  Name:  Latoya Weiss    MRN: [id number] DOB: [birth year]  04/10/2019  [name] was observed post Covid-19 immunization for 15 minutes without incidence. She was provided with Vaccine Information Sheet and instruction to access the V-Safe system.   [name] was instructed to call 911 with any severe reactions post vaccine: Marland Kitchen Difficulty breathing  . Swelling of your face and throat  . A fast heartbeat  . A bad rash all over your body  . Dizziness and weakness    Immunizations Administered    Name Date Dose VIS Date Route   Pfizer COVID-19 Vaccine 04/10/2019  2:39 PM 0.3 mL 02/08/2019 Intramuscular   Manufacturer: [place]   Lot: [id number]   [place]: [id number]

## 2019-06-28 MED FILL — buPROPion HCL ER (XL) 300 M: 300 | 90 days supply | Qty: 90 | Fill #0

## 2019-09-17 DIAGNOSIS — H5213 Myopia, bilateral: Secondary | ICD-10-CM | POA: Diagnosis not present

## 2019-10-04 MED FILL — buPROPion HCL ER (XL) 300 M: 300 | 90 days supply | Qty: 90 | Fill #1

## 2019-11-05 DIAGNOSIS — Z1231 Encounter for screening mammogram for malignant neoplasm of breast: Secondary | ICD-10-CM | POA: Diagnosis not present

## 2019-11-05 DIAGNOSIS — Z1151 Encounter for screening for human papillomavirus (HPV): Secondary | ICD-10-CM | POA: Diagnosis not present

## 2019-11-05 DIAGNOSIS — Z6821 Body mass index (BMI) 21.0-21.9, adult: Secondary | ICD-10-CM | POA: Diagnosis not present

## 2019-11-05 DIAGNOSIS — N920 Excessive and frequent menstruation with regular cycle: Secondary | ICD-10-CM | POA: Diagnosis not present

## 2019-11-05 DIAGNOSIS — Z01419 Encounter for gynecological examination (general) (routine) without abnormal findings: Secondary | ICD-10-CM | POA: Diagnosis not present

## 2019-11-06 DIAGNOSIS — M9902 Segmental and somatic dysfunction of thoracic region: Secondary | ICD-10-CM | POA: Diagnosis not present

## 2019-11-06 DIAGNOSIS — M9900 Segmental and somatic dysfunction of head region: Secondary | ICD-10-CM | POA: Diagnosis not present

## 2019-11-06 DIAGNOSIS — G44021 Chronic cluster headache, intractable: Secondary | ICD-10-CM | POA: Diagnosis not present

## 2019-11-06 DIAGNOSIS — M9901 Segmental and somatic dysfunction of cervical region: Secondary | ICD-10-CM | POA: Diagnosis not present

## 2019-11-20 DIAGNOSIS — M9900 Segmental and somatic dysfunction of head region: Secondary | ICD-10-CM | POA: Diagnosis not present

## 2019-11-20 DIAGNOSIS — G44021 Chronic cluster headache, intractable: Secondary | ICD-10-CM | POA: Diagnosis not present

## 2019-11-20 DIAGNOSIS — M9901 Segmental and somatic dysfunction of cervical region: Secondary | ICD-10-CM | POA: Diagnosis not present

## 2019-11-20 DIAGNOSIS — M9902 Segmental and somatic dysfunction of thoracic region: Secondary | ICD-10-CM | POA: Diagnosis not present

## 2020-02-14 ENCOUNTER — Other Ambulatory Visit (HOSPITAL_COMMUNITY): Payer: Self-pay | Admitting: Obstetrics and Gynecology

## 2020-02-14 MED FILL — buPROPion HCL ER (XL) 300 M: 300 | 90 days supply | Qty: 90 | Fill #0

## 2020-02-17 DIAGNOSIS — R519 Headache, unspecified: Secondary | ICD-10-CM | POA: Diagnosis not present

## 2020-02-17 DIAGNOSIS — Z20822 Contact with and (suspected) exposure to covid-19: Secondary | ICD-10-CM | POA: Diagnosis not present

## 2020-02-17 DIAGNOSIS — R059 Cough, unspecified: Secondary | ICD-10-CM | POA: Diagnosis not present

## 2020-03-25 DIAGNOSIS — F411 Generalized anxiety disorder: Secondary | ICD-10-CM | POA: Diagnosis not present

## 2020-03-25 DIAGNOSIS — Z0000 Encounter for general adult medical examination without abnormal findings: Secondary | ICD-10-CM | POA: Diagnosis not present

## 2020-03-25 DIAGNOSIS — N898 Other specified noninflammatory disorders of vagina: Secondary | ICD-10-CM | POA: Diagnosis not present

## 2020-03-25 DIAGNOSIS — Z8619 Personal history of other infectious and parasitic diseases: Secondary | ICD-10-CM | POA: Diagnosis not present

## 2020-03-25 DIAGNOSIS — E559 Vitamin D deficiency, unspecified: Secondary | ICD-10-CM | POA: Diagnosis not present

## 2020-03-25 DIAGNOSIS — I341 Nonrheumatic mitral (valve) prolapse: Secondary | ICD-10-CM | POA: Diagnosis not present

## 2020-03-25 DIAGNOSIS — Z13220 Encounter for screening for lipoid disorders: Secondary | ICD-10-CM | POA: Diagnosis not present

## 2020-03-25 DIAGNOSIS — Z1283 Encounter for screening for malignant neoplasm of skin: Secondary | ICD-10-CM | POA: Diagnosis not present

## 2020-05-17 IMAGING — CR DG LUMBAR SPINE 2-3V
2 series · 2 of 2 positions shown · non-contrast
Comparison: March 25, 2017

CLINICAL DATA: Lumbago

EXAM:
LUMBAR SPINE - 2-3 VIEW

[w lumbar spine ap]
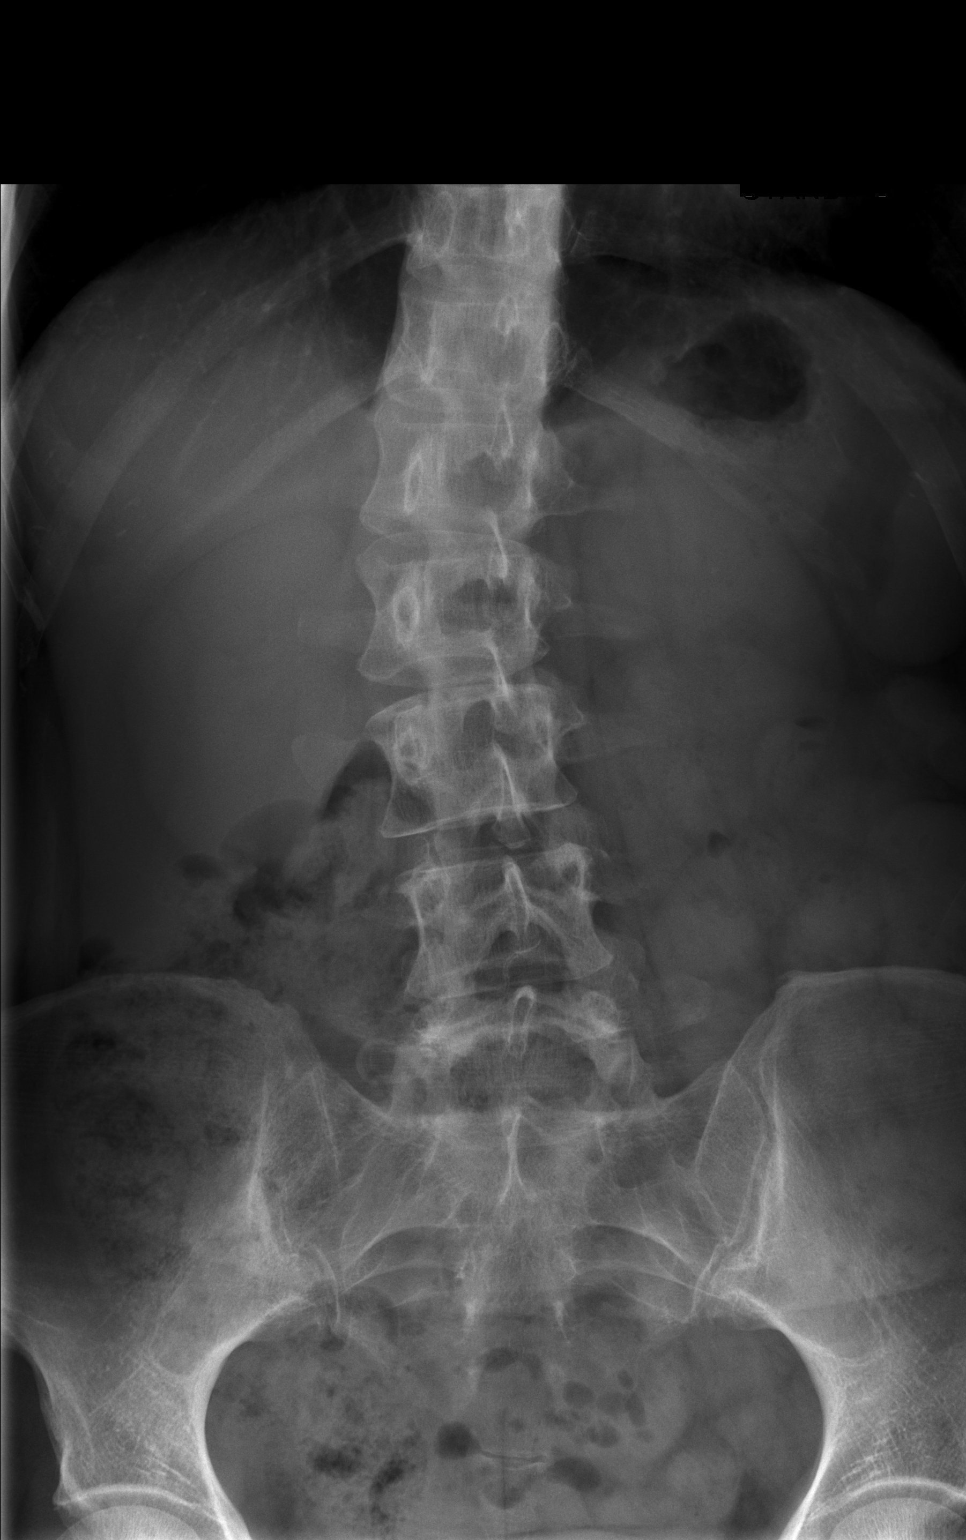

[w lumbar spine lat]
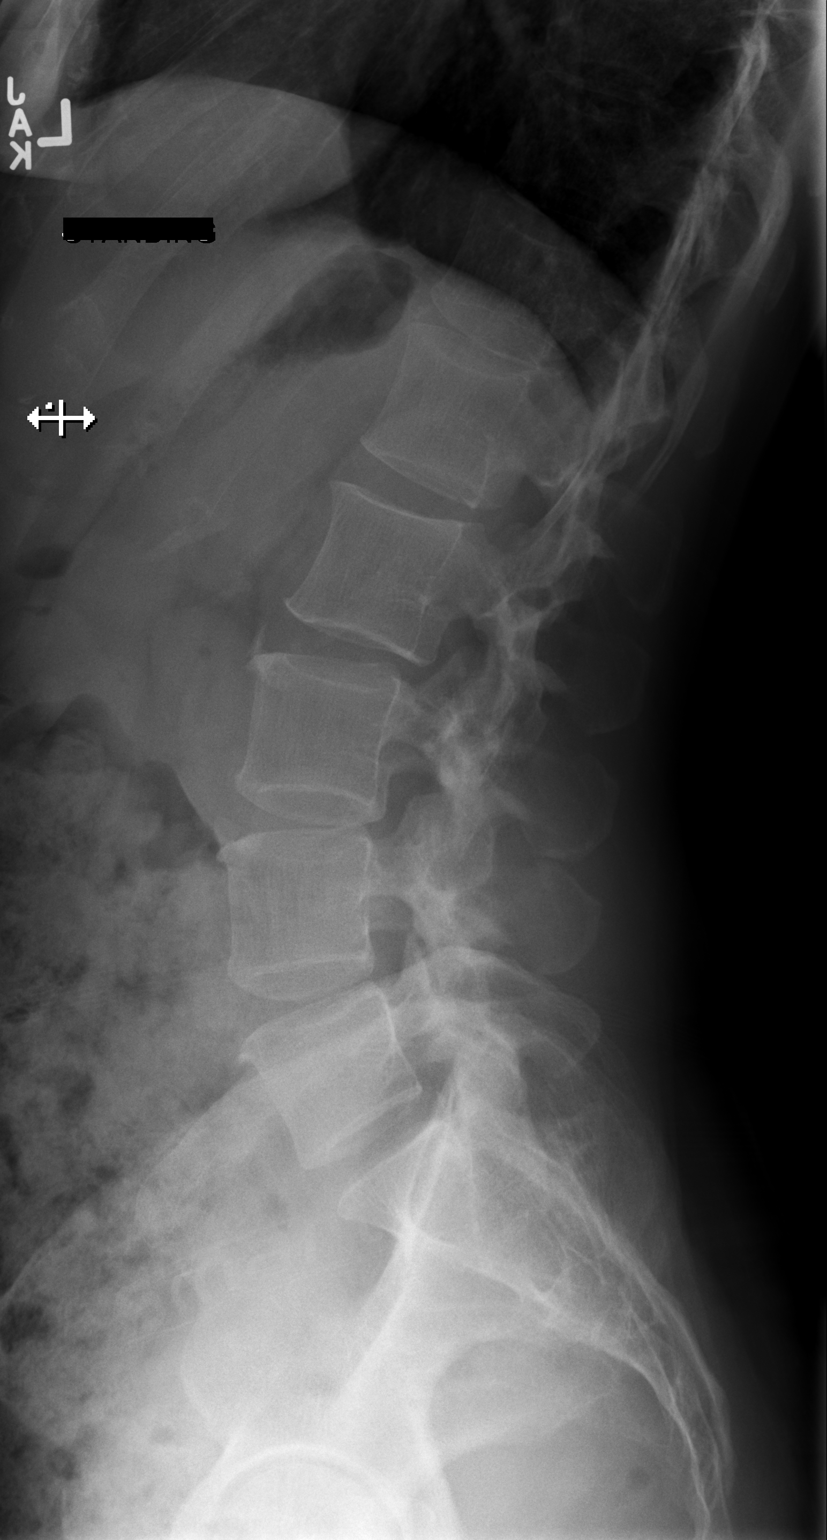

[2 of 2 positions shown; findings below may reference images not displayed]

FINDINGS: Frontal and lateral views were obtained with patient standing. There
are 5 non-rib-bearing lumbar type vertebral bodies. There is
dextroscoliosis with rotatory component. No fracture or
spondylolisthesis. Disc spaces appear unremarkable. No erosive
change.
IMPRESSION: Scoliosis. No fracture or spondylolisthesis. No appreciable
arthropathy.

## 2020-05-19 ENCOUNTER — Other Ambulatory Visit (HOSPITAL_BASED_OUTPATIENT_CLINIC_OR_DEPARTMENT_OTHER): Payer: Self-pay

## 2020-08-22 ENCOUNTER — Other Ambulatory Visit (HOSPITAL_COMMUNITY): Payer: Self-pay

## 2020-08-22 MED FILL — Bupropion HCl Tab ER 24HR 300 MG: ORAL | 90 days supply | Qty: 90 | Fill #0 | Status: AC

## 2020-09-04 ENCOUNTER — Other Ambulatory Visit (HOSPITAL_COMMUNITY): Payer: Self-pay

## 2020-09-07 ENCOUNTER — Other Ambulatory Visit (HOSPITAL_COMMUNITY): Payer: Self-pay

## 2020-10-09 DIAGNOSIS — Z111 Encounter for screening for respiratory tuberculosis: Secondary | ICD-10-CM | POA: Diagnosis not present

## 2020-10-09 DIAGNOSIS — Z136 Encounter for screening for cardiovascular disorders: Secondary | ICD-10-CM | POA: Diagnosis not present

## 2020-10-09 DIAGNOSIS — E559 Vitamin D deficiency, unspecified: Secondary | ICD-10-CM | POA: Diagnosis not present

## 2020-10-09 DIAGNOSIS — Z0000 Encounter for general adult medical examination without abnormal findings: Secondary | ICD-10-CM | POA: Diagnosis not present

## 2020-10-09 DIAGNOSIS — Z13220 Encounter for screening for lipoid disorders: Secondary | ICD-10-CM | POA: Diagnosis not present

## 2020-10-09 DIAGNOSIS — R760 Raised antibody titer: Secondary | ICD-10-CM | POA: Diagnosis not present

## 2020-10-26 DIAGNOSIS — Z23 Encounter for immunization: Secondary | ICD-10-CM | POA: Diagnosis not present

## 2020-11-12 DIAGNOSIS — Z1231 Encounter for screening mammogram for malignant neoplasm of breast: Secondary | ICD-10-CM | POA: Diagnosis not present

## 2020-11-20 DIAGNOSIS — Z23 Encounter for immunization: Secondary | ICD-10-CM | POA: Diagnosis not present

## 2022-01-12 ENCOUNTER — Other Ambulatory Visit (HOSPITAL_COMMUNITY): Payer: Self-pay
